# Patient Record
Sex: Female | Born: 1965 | Race: White | Hispanic: No | Marital: Single | Smoking: Never smoker
Health system: Southern US, Community
[De-identification: ages and names within clinical notes are randomized; demographics above are authoritative.]

## PROBLEM LIST (undated history)

## (undated) DIAGNOSIS — F419 Anxiety disorder, unspecified: Secondary | ICD-10-CM

## (undated) DIAGNOSIS — M549 Dorsalgia, unspecified: Secondary | ICD-10-CM

## (undated) DIAGNOSIS — G4700 Insomnia, unspecified: Secondary | ICD-10-CM

## (undated) DIAGNOSIS — R599 Enlarged lymph nodes, unspecified: Secondary | ICD-10-CM

## (undated) DIAGNOSIS — F438 Other reactions to severe stress: Secondary | ICD-10-CM

## (undated) HISTORY — DX: Dorsalgia, unspecified: M54.9

## (undated) HISTORY — DX: Insomnia, unspecified: G47.00

## (undated) HISTORY — DX: Other reactions to severe stress: F43.8

## (undated) HISTORY — PX: OTHER SURGICAL HISTORY: SHX169

## (undated) HISTORY — DX: Enlarged lymph nodes, unspecified: R59.9

## (undated) HISTORY — DX: Anxiety disorder, unspecified: F41.9

---

## 1985-10-29 HISTORY — PX: GYNECOLOGIC CRYOSURGERY: SHX857

## 1998-04-04 ENCOUNTER — Other Ambulatory Visit: Admission: RE | Admit: 1998-04-04 | Discharge: 1998-04-04 | Payer: Self-pay | Admitting: Obstetrics and Gynecology

## 1999-06-15 ENCOUNTER — Other Ambulatory Visit: Admission: RE | Admit: 1999-06-15 | Discharge: 1999-06-15 | Payer: Self-pay | Admitting: Obstetrics and Gynecology

## 2000-08-13 ENCOUNTER — Other Ambulatory Visit: Admission: RE | Admit: 2000-08-13 | Discharge: 2000-08-13 | Payer: Self-pay | Admitting: Obstetrics and Gynecology

## 2001-01-01 ENCOUNTER — Encounter: Payer: Self-pay | Admitting: Obstetrics and Gynecology

## 2001-01-01 ENCOUNTER — Ambulatory Visit (HOSPITAL_COMMUNITY): Admission: RE | Admit: 2001-01-01 | Discharge: 2001-01-01 | Payer: Self-pay | Admitting: Obstetrics and Gynecology

## 2001-10-29 HISTORY — PX: AUGMENTATION MAMMAPLASTY: SUR837

## 2001-12-09 ENCOUNTER — Other Ambulatory Visit: Admission: RE | Admit: 2001-12-09 | Discharge: 2001-12-09 | Payer: Self-pay | Admitting: Obstetrics and Gynecology

## 2003-01-11 ENCOUNTER — Encounter: Payer: Self-pay | Admitting: Emergency Medicine

## 2003-01-11 ENCOUNTER — Emergency Department (HOSPITAL_COMMUNITY): Admission: EM | Admit: 2003-01-11 | Discharge: 2003-01-12 | Payer: Self-pay | Admitting: Emergency Medicine

## 2003-01-26 ENCOUNTER — Other Ambulatory Visit: Admission: RE | Admit: 2003-01-26 | Discharge: 2003-01-26 | Payer: Self-pay | Admitting: Obstetrics and Gynecology

## 2003-03-12 ENCOUNTER — Encounter: Admission: RE | Admit: 2003-03-12 | Discharge: 2003-03-12 | Payer: Self-pay | Admitting: Family Medicine

## 2003-03-12 ENCOUNTER — Encounter: Payer: Self-pay | Admitting: Family Medicine

## 2003-05-28 ENCOUNTER — Ambulatory Visit (HOSPITAL_COMMUNITY): Admission: RE | Admit: 2003-05-28 | Discharge: 2003-05-28 | Payer: Self-pay | Admitting: Neurology

## 2004-03-07 ENCOUNTER — Other Ambulatory Visit: Admission: RE | Admit: 2004-03-07 | Discharge: 2004-03-07 | Payer: Self-pay | Admitting: Obstetrics and Gynecology

## 2006-03-29 ENCOUNTER — Other Ambulatory Visit: Admission: RE | Admit: 2006-03-29 | Discharge: 2006-03-29 | Payer: Self-pay | Admitting: Obstetrics and Gynecology

## 2006-05-28 ENCOUNTER — Encounter: Admission: RE | Admit: 2006-05-28 | Discharge: 2006-05-28 | Payer: Self-pay | Admitting: Obstetrics and Gynecology

## 2007-04-15 ENCOUNTER — Other Ambulatory Visit: Admission: RE | Admit: 2007-04-15 | Discharge: 2007-04-15 | Payer: Self-pay | Admitting: Obstetrics and Gynecology

## 2008-06-10 ENCOUNTER — Other Ambulatory Visit: Admission: RE | Admit: 2008-06-10 | Discharge: 2008-06-10 | Payer: Self-pay | Admitting: Obstetrics and Gynecology

## 2008-06-15 ENCOUNTER — Encounter: Admission: RE | Admit: 2008-06-15 | Discharge: 2008-06-15 | Payer: Self-pay | Admitting: Obstetrics and Gynecology

## 2009-02-15 ENCOUNTER — Ambulatory Visit: Payer: Self-pay | Admitting: Family Medicine

## 2009-02-15 DIAGNOSIS — R599 Enlarged lymph nodes, unspecified: Secondary | ICD-10-CM

## 2009-02-15 DIAGNOSIS — M549 Dorsalgia, unspecified: Secondary | ICD-10-CM | POA: Insufficient documentation

## 2009-02-15 DIAGNOSIS — G4700 Insomnia, unspecified: Secondary | ICD-10-CM

## 2009-02-15 HISTORY — DX: Insomnia, unspecified: G47.00

## 2009-02-15 HISTORY — DX: Dorsalgia, unspecified: M54.9

## 2009-02-15 HISTORY — DX: Enlarged lymph nodes, unspecified: R59.9

## 2009-03-24 ENCOUNTER — Encounter: Payer: Self-pay | Admitting: Family Medicine

## 2009-05-23 ENCOUNTER — Encounter: Payer: Self-pay | Admitting: Family Medicine

## 2009-06-13 ENCOUNTER — Other Ambulatory Visit: Admission: RE | Admit: 2009-06-13 | Discharge: 2009-06-13 | Payer: Self-pay | Admitting: Obstetrics and Gynecology

## 2009-06-13 ENCOUNTER — Encounter: Payer: Self-pay | Admitting: Obstetrics and Gynecology

## 2009-06-13 ENCOUNTER — Ambulatory Visit: Payer: Self-pay | Admitting: Obstetrics and Gynecology

## 2010-01-11 ENCOUNTER — Ambulatory Visit: Payer: Self-pay | Admitting: Family Medicine

## 2010-06-20 ENCOUNTER — Ambulatory Visit: Payer: Self-pay | Admitting: Obstetrics and Gynecology

## 2010-06-20 ENCOUNTER — Other Ambulatory Visit: Admission: RE | Admit: 2010-06-20 | Discharge: 2010-06-20 | Payer: Self-pay | Admitting: Obstetrics and Gynecology

## 2010-07-17 ENCOUNTER — Ambulatory Visit: Payer: Self-pay | Admitting: Family Medicine

## 2010-07-17 DIAGNOSIS — J029 Acute pharyngitis, unspecified: Secondary | ICD-10-CM | POA: Insufficient documentation

## 2010-07-17 DIAGNOSIS — B009 Herpesviral infection, unspecified: Secondary | ICD-10-CM | POA: Insufficient documentation

## 2010-07-17 DIAGNOSIS — J019 Acute sinusitis, unspecified: Secondary | ICD-10-CM | POA: Insufficient documentation

## 2010-07-21 ENCOUNTER — Encounter (INDEPENDENT_AMBULATORY_CARE_PROVIDER_SITE_OTHER): Payer: Self-pay | Admitting: *Deleted

## 2010-08-17 ENCOUNTER — Encounter (INDEPENDENT_AMBULATORY_CARE_PROVIDER_SITE_OTHER): Payer: Self-pay | Admitting: *Deleted

## 2010-08-21 ENCOUNTER — Ambulatory Visit: Payer: Self-pay | Admitting: Gastroenterology

## 2010-11-27 ENCOUNTER — Ambulatory Visit
Admission: RE | Admit: 2010-11-27 | Discharge: 2010-11-27 | Payer: Self-pay | Source: Home / Self Care | Attending: Family Medicine | Admitting: Family Medicine

## 2010-11-27 DIAGNOSIS — F438 Other reactions to severe stress: Secondary | ICD-10-CM

## 2010-11-27 DIAGNOSIS — F4389 Other reactions to severe stress: Secondary | ICD-10-CM

## 2010-11-27 DIAGNOSIS — F411 Generalized anxiety disorder: Secondary | ICD-10-CM | POA: Insufficient documentation

## 2010-11-27 HISTORY — DX: Other reactions to severe stress: F43.89

## 2010-11-27 HISTORY — DX: Other reactions to severe stress: F43.8

## 2010-11-30 NOTE — Letter (Signed)
Summary: Pre Visit Letter Revised  [facility]  [address] [place], [place] [address]   Phone: [phone]  Fax: [phone]        07/21/2010 MRN: [id number] [facility] [address] [place], [place]  [address]             Procedure Date:  09-04-10   Welcome to the Gastroenterology Division at [facility].    You are scheduled to see a nurse for your pre-procedure visit on 08-21-10 at 2:00p.m. on the 3rd floor at [facility], [address]  We ask that you try to arrive at our office 15 minutes prior to your appointment time to allow for check-in.  Please take a minute to review the attached form.  If you answer "Yes" to one or more of the questions on the first page, we ask that you call the person listed at your earliest opportunity.  If you answer "No" to all of the questions, please complete the rest of the form and bring it to your appointment.    Your nurse visit will consist of discussing your medical and surgical history, your immediate family medical history, and your medications.   If you are unable to list all of your medications on the form, please bring the medication bottles to your appointment and we will list them.  We will need to be aware of both prescribed and over the counter drugs.  We will need to know exact dosage information as well.    Please be prepared to read and sign documents such as consent forms, a financial agreement, and acknowledgement forms.  If necessary, and with your consent, a friend or relative is welcome to sit-in on the nurse visit with you.  Please bring your insurance card so that we may make a copy of it.  If your insurance requires a referral to see a specialist, please bring your referral form from your primary care physician.  No co-pay is required for this nurse visit.     If you cannot keep your appointment, please call [phone] to cancel or reschedule prior to your appointment date.  This allows  [place] the opportunity to schedule an appointment for another patient in need of care.    Thank you for choosing [facility] for your medical needs.  We appreciate the opportunity to care for you.  Please visit [place] at our website  to learn more about our practice.  Sincerely, The Gastroenterology Division
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
## 2010-11-30 NOTE — Miscellaneous (Signed)
Summary: previsit prep/rm  Clinical Lists Changes  Medications: Added new medication of MOVIPREP 100 GM  SOLR (PEG-KCL-NACL-NASULF-NA ASC-C) As per prep instructions. - Signed Rx of MOVIPREP 100 GM  SOLR (PEG-KCL-NACL-NASULF-NA ASC-C) As per prep instructions.;  #1 x 0;  Signed;  Entered by: Sherren Kerns RN;  Authorized by: Rachael Fee MD;  Method used: Electronically to [facility]. [phone]*, [address] [place], [place], [place]  [address], Ph: [phone], Fax: [phone] Observations: Added new observation of ALLERGY REV: Done (08/21/2010 14:02)    Prescriptions: MOVIPREP 100 GM  SOLR (PEG-KCL-NACL-NASULF-NA ASC-C) As per prep instructions.  #1 x 0   Entered by:   Sherren Kerns RN   Authorized by:   Rachael Fee MD   Signed by:   Sherren Kerns RN on 08/21/2010   Method used:   Electronically to        [facility] [phone]* (retail)       [address]       [place], [place]  [address]       Ph: [phone]       Fax: [phone]   RxID:   [id number]
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
## 2010-11-30 NOTE — Letter (Signed)
Summary: Moviprep Instructions  [facility]  [address]   [place], [place] [address]   Phone: [phone]  Fax: [phone]       Sara Camacho    1966/09/15    MRN: [id number]        Procedure Day /Date: Monday, 09-04-10     Arrival Time: 8:00 a.m.     Procedure Time: 9:00 a.m.     Location of Procedure:                     x  [facility] (4th Floor)                        PREPARATION FOR COLONOSCOPY WITH MOVIPREP   Starting 5 days prior to your procedure  08-30-10 do not eat nuts, seeds, popcorn, corn, beans, peas,  salads, or any raw vegetables.  Do not take any fiber supplements (e.g. Metamucil, Citrucel, and Benefiber).  THE DAY BEFORE YOUR PROCEDURE         DATE: 09-03-10   DAY: Sunday  1.  Drink clear liquids the entire day-NO SOLID FOOD  2.  Do not drink anything colored red or purple.  Avoid juices with pulp.  No orange juice.  3.  Drink at least 64 oz. (8 glasses) of fluid/clear liquids during the day to prevent dehydration and help the prep work efficiently.  CLEAR LIQUIDS INCLUDE: Water Jello Ice Popsicles Tea (sugar ok, no milk/cream) Powdered fruit flavored drinks Coffee (sugar ok, no milk/cream) Gatorade Juice: apple, white grape, white cranberry  Lemonade Clear bullion, consomm, broth Carbonated beverages (any kind) Strained chicken noodle soup Hard Candy                             4.  In the morning, mix first dose of MoviPrep solution:    Empty 1 Pouch A and 1 Pouch B into the disposable container    Add lukewarm drinking water to the top line of the container. Mix to dissolve    Refrigerate (mixed solution should be used within 24 hrs)  5.  Begin drinking the prep at 5:00 p.m. The MoviPrep container is divided by 4 marks.   Every 15 minutes drink the solution down to the next mark (approximately 8 oz) until the full liter is complete.   6.  Follow completed prep with 16 oz of clear liquid of your choice  (Nothing red or purple).  Continue to drink clear liquids until bedtime.  7.  Before going to bed, mix second dose of MoviPrep solution:    Empty 1 Pouch A and 1 Pouch B into the disposable container    Add lukewarm drinking water to the top line of the container. Mix to dissolve    Refrigerate  THE DAY OF YOUR PROCEDURE      DATE: 09-04-10  DAY: Monday  Beginning at  4:00 a.m. (5 hours before procedure):         1. Every 15 minutes, drink the solution down to the next mark (approx 8 oz) until the full liter is complete.  2. Follow completed prep with 16 oz. of clear liquid of your choice.    3. You may drink clear liquids until 7:00 a.m.   (2 HOURS BEFORE PROCEDURE).   MEDICATION INSTRUCTIONS  Unless otherwise instructed, you should take regular prescription medications with a small sip of water   as  early as possible the morning of your procedure.            OTHER INSTRUCTIONS  You will need a responsible adult at least 46 years of age to accompany you and drive you home.   This person must remain in the waiting room during your procedure.  Wear loose fitting clothing that is easily removed.  Leave jewelry and other valuables at home.  However, you may wish to bring a book to read or  an iPod/MP3 player to listen to music as you wait for your procedure to start.  Remove all body piercing jewelry and leave at home.  Total time from sign-in until discharge is approximately 2-3 hours.  You should go home directly after your procedure and rest.  You can resume normal activities the  day after your procedure.  The day of your procedure you should not:   Drive   Make legal decisions   Operate machinery   Drink alcohol   Return to work  You will receive specific instructions about eating, activities and medications before you leave.    The above instructions have been reviewed and explained to me by   Sherren Kerns RN  August 21, 2010 2:28 PM    I fully  understand and can verbalize these instructions _____________________________ Date _________
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
## 2010-12-22 ENCOUNTER — Other Ambulatory Visit: Payer: Self-pay | Admitting: Obstetrics and Gynecology

## 2010-12-22 DIAGNOSIS — Z1231 Encounter for screening mammogram for malignant neoplasm of breast: Secondary | ICD-10-CM

## 2010-12-27 ENCOUNTER — Ambulatory Visit
Admission: RE | Admit: 2010-12-27 | Discharge: 2010-12-27 | Disposition: A | Discharge status: Home / Self Care | Payer: Self-pay | Source: Ambulatory Visit | Attending: Obstetrics and Gynecology | Admitting: Obstetrics and Gynecology

## 2010-12-27 DIAGNOSIS — Z1231 Encounter for screening mammogram for malignant neoplasm of breast: Secondary | ICD-10-CM

## 2011-04-05 ENCOUNTER — Ambulatory Visit (INDEPENDENT_AMBULATORY_CARE_PROVIDER_SITE_OTHER)
Admission: RE | Admit: 2011-04-05 | Discharge: 2011-04-05 | Disposition: A | Discharge status: Home / Self Care | Payer: BC Managed Care – PPO | Source: Ambulatory Visit | Attending: Family Medicine | Admitting: Family Medicine

## 2011-04-05 ENCOUNTER — Ambulatory Visit (INDEPENDENT_AMBULATORY_CARE_PROVIDER_SITE_OTHER): Payer: BC Managed Care – PPO | Admitting: Family Medicine

## 2011-04-05 ENCOUNTER — Encounter: Payer: Self-pay | Admitting: Family Medicine

## 2011-04-05 DIAGNOSIS — M549 Dorsalgia, unspecified: Secondary | ICD-10-CM

## 2011-04-05 DIAGNOSIS — L719 Rosacea, unspecified: Secondary | ICD-10-CM

## 2011-04-05 DIAGNOSIS — L710 Perioral dermatitis: Secondary | ICD-10-CM

## 2011-04-05 MED ORDER — CEPHALEXIN 500 MG PO CAPS: 500.0000 mg | ORAL_CAPSULE | Freq: Two times a day (BID) | ORAL | Status: AC

## 2011-04-05 MED ORDER — MELOXICAM 15 MG PO TABS: 15.0000 mg | ORAL_TABLET | Freq: Every day | ORAL | Status: DC
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
## 2011-04-06 NOTE — Progress Notes (Signed)
Quick Note:  Left message on pt's voice mail. ______

## 2011-05-24 ENCOUNTER — Ambulatory Visit: Payer: BC Managed Care – PPO | Admitting: Family Medicine

## 2011-05-24 ENCOUNTER — Ambulatory Visit (INDEPENDENT_AMBULATORY_CARE_PROVIDER_SITE_OTHER): Payer: BC Managed Care – PPO | Admitting: Family Medicine

## 2011-05-24 VITALS — BP 120/70

## 2011-05-24 DIAGNOSIS — Z23 Encounter for immunization: Secondary | ICD-10-CM

## 2011-05-24 DIAGNOSIS — Z139 Encounter for screening, unspecified: Secondary | ICD-10-CM

## 2011-05-24 DIAGNOSIS — Z0289 Encounter for other administrative examinations: Secondary | ICD-10-CM

## 2011-05-24 MED ORDER — TRETINOIN 0.05 % EX CREA: TOPICAL_CREAM | Freq: Every day | CUTANEOUS | Status: DC

## 2011-05-24 NOTE — Progress Notes (Signed)
  Subjective:    Patient ID: Sara Camacho, female    DOB: Mar 30, 1966, 45 y.o.   MRN: [id number]  HPI Patient preparing to start phlebotomy program. No history of immunizations-pt could not locate records. She did have tetanus 2005. She does recall having immunizations as a child. She had clinical chickenpox. She has not had hepatitis B vaccine. No history of positive PPD and no specific risk factors. No recent concerning symptoms. Her program requires proof of measles, mumps, rubella positive serology if immunizations not documented.   Review of Systems  Constitutional: Negative for fever.  Skin: Negative for rash.  Neurological: Negative for headaches.       Objective:   Physical Exam  Constitutional: She appears well-developed and well-nourished.  Cardiovascular: Normal rate and regular rhythm.   Pulmonary/Chest: Breath sounds normal. No respiratory distress. She has no wheezes. She has no rales.          Assessment & Plan:  Screening for immunization serologies. Check for rubella, measles and mumps antibody levels. Recommended strongly that she start hepatitis B vaccine series and she consents to getting first today. PPD will be placed tomorrow and read in72 hours.  She'll confirm date of last tetanus

## 2011-05-25 ENCOUNTER — Encounter: Payer: Self-pay | Admitting: Family Medicine

## 2011-05-25 ENCOUNTER — Ambulatory Visit: Payer: BC Managed Care – PPO | Admitting: Family Medicine

## 2011-05-25 DIAGNOSIS — Z111 Encounter for screening for respiratory tuberculosis: Secondary | ICD-10-CM

## 2011-05-25 DIAGNOSIS — Z299 Encounter for prophylactic measures, unspecified: Secondary | ICD-10-CM

## 2011-05-25 LAB — RUBEOLA ANTIBODY IGG: Rubeola IgG: 4.33 {ISR} — ABNORMAL HIGH

## 2011-05-25 LAB — MUMPS ANTIBODY, IGG: Mumps IgG: 3.37 {ISR} — ABNORMAL HIGH

## 2011-05-25 NOTE — Progress Notes (Signed)
Quick Note:  Copy given to pt ______

## 2011-05-28 ENCOUNTER — Ambulatory Visit: Payer: BC Managed Care – PPO | Admitting: Family Medicine

## 2011-05-28 LAB — RUBELLA ANTIBODY, IGM: Rubella IgM: 0.35 Ratio (ref <0.90)

## 2011-05-28 LAB — TB SKIN TEST: TB Skin Test: NEGATIVE mm

## 2011-06-19 ENCOUNTER — Other Ambulatory Visit: Payer: Self-pay | Admitting: Family Medicine

## 2011-06-19 NOTE — Telephone Encounter (Signed)
Last filled 11-27-10 #30 with 5 refills Last acute OV shoulder pain 6/12

## 2011-06-19 NOTE — Telephone Encounter (Signed)
Ok to refill once with 2 additional refills.

## 2011-08-02 ENCOUNTER — Other Ambulatory Visit: Payer: Self-pay | Admitting: Obstetrics and Gynecology

## 2011-08-06 ENCOUNTER — Encounter: Payer: Self-pay | Admitting: Gynecology

## 2011-08-08 ENCOUNTER — Encounter: Payer: Self-pay | Admitting: Obstetrics and Gynecology

## 2011-08-08 ENCOUNTER — Other Ambulatory Visit (HOSPITAL_COMMUNITY)
Admission: RE | Admit: 2011-08-08 | Discharge: 2011-08-08 | Disposition: A | Discharge status: Home / Self Care | Payer: BC Managed Care – PPO | Source: Ambulatory Visit | Attending: Obstetrics and Gynecology | Admitting: Obstetrics and Gynecology

## 2011-08-08 ENCOUNTER — Ambulatory Visit (INDEPENDENT_AMBULATORY_CARE_PROVIDER_SITE_OTHER): Payer: BC Managed Care – PPO | Admitting: Obstetrics and Gynecology

## 2011-08-08 VITALS — BP 114/70 | Ht 62.0 in | Wt 114.0 lb

## 2011-08-08 DIAGNOSIS — Z113 Encounter for screening for infections with a predominantly sexual mode of transmission: Secondary | ICD-10-CM

## 2011-08-08 DIAGNOSIS — B009 Herpesviral infection, unspecified: Secondary | ICD-10-CM

## 2011-08-08 DIAGNOSIS — Z01419 Encounter for gynecological examination (general) (routine) without abnormal findings: Secondary | ICD-10-CM | POA: Insufficient documentation

## 2011-08-08 DIAGNOSIS — L708 Other acne: Secondary | ICD-10-CM

## 2011-08-08 DIAGNOSIS — Z309 Encounter for contraceptive management, unspecified: Secondary | ICD-10-CM

## 2011-08-08 DIAGNOSIS — L709 Acne, unspecified: Secondary | ICD-10-CM

## 2011-08-08 DIAGNOSIS — Z13220 Encounter for screening for lipoid disorders: Secondary | ICD-10-CM

## 2011-08-08 DIAGNOSIS — N870 Mild cervical dysplasia: Secondary | ICD-10-CM | POA: Insufficient documentation

## 2011-08-08 DIAGNOSIS — G479 Sleep disorder, unspecified: Secondary | ICD-10-CM

## 2011-08-08 MED ORDER — ZOLPIDEM TARTRATE 10 MG PO TABS: 10.0000 mg | ORAL_TABLET | Freq: Every evening | ORAL | Status: DC | PRN

## 2011-08-08 MED ORDER — NORGESTIM-ETH ESTRAD TRIPHASIC 0.18/0.215/0.25 MG-35 MCG PO TABS: ORAL_TABLET | ORAL | Status: DC

## 2011-08-08 MED ORDER — VALACYCLOVIR HCL 1 G PO TABS: 1000.0000 mg | ORAL_TABLET | Freq: Every day | ORAL | Status: DC

## 2011-08-08 MED ORDER — TRETINOIN 0.05 % EX CREA: TOPICAL_CREAM | Freq: Every day | CUTANEOUS | Status: DC

## 2011-08-08 NOTE — Progress Notes (Signed)
Patient came to see me today for her annual GYN exam. She remains on continuous birth control pills and therefore does not have a withdrawal bleed. She is up-to-date on mammograms. She takes Valtrex for fever blisters. She uses Retin-A when she has trouble with acne. She uses Ambien for sleep disturbance.  Physical examination: HEENT within normal limits. Neck: Thyroid not large. No masses. Supraclavicular nodes: not enlarged. Breasts: Examined in both sitting midline position. No skin changes and no masses. Abdomen: Soft no guarding rebound or masses or hernia. Pelvic: External: Within normal limits. BUS: Within normal limits. Vaginal:within normal limits. Good estrogen effect. No evidence of cystocele rectocele or enterocele. Cervix: clean. Uterus: Normal size and shape. Adnexa: No masses. Rectovaginal exam: Confirmatory and negative. Extremities: Within normal limits.  Assessment: Normal GYN exam. HSV 1.  Plan: Continue her birth control pills continuously. If she has BTB stopped pills for 4 days and then restart them. Her current partner is of 8 months duration. We did GC and chlamydial cultures today. We refilled her Ambien and her Valtrex today. I refilled her Retin-A but told her I thought she should discuss that with Dr. Nicholas Lose and let her prescribed in the future.

## 2011-09-07 ENCOUNTER — Encounter: Payer: BC Managed Care – PPO | Admitting: Obstetrics and Gynecology

## 2011-10-18 ENCOUNTER — Telehealth: Payer: Self-pay | Admitting: *Deleted

## 2011-10-18 NOTE — Telephone Encounter (Signed)
Pt called stating she lost her prescription  for generic Ambien 10 mg. Pt has refills at the pharmacy, but she can not pick up unless she can get the okay from you. Pt is leaving out to town for [place] on Friday. Please advise

## 2011-10-18 NOTE — Telephone Encounter (Signed)
Okay to refill? 

## 2011-10-18 NOTE — Telephone Encounter (Signed)
rx called in, okay for pharmacy to refill rx.

## 2011-12-12 ENCOUNTER — Encounter: Payer: Self-pay | Admitting: Family Medicine

## 2011-12-12 ENCOUNTER — Ambulatory Visit (INDEPENDENT_AMBULATORY_CARE_PROVIDER_SITE_OTHER): Payer: BC Managed Care – PPO | Admitting: Family Medicine

## 2011-12-12 VITALS — BP 112/68 | Temp 98.0°F | Wt 114.0 lb

## 2011-12-12 DIAGNOSIS — J029 Acute pharyngitis, unspecified: Secondary | ICD-10-CM

## 2011-12-12 MED ORDER — BENZOYL PEROXIDE-ERYTHROMYCIN 5-3 % EX GEL: Freq: Two times a day (BID) | CUTANEOUS | Status: DC

## 2011-12-12 MED ORDER — HYDROCODONE-HOMATROPINE 5-1.5 MG/5ML PO SYRP: 5.0000 mL | ORAL_SOLUTION | Freq: Four times a day (QID) | ORAL | Status: AC | PRN

## 2011-12-12 NOTE — Progress Notes (Signed)
  Subjective:    Patient ID: Sara Camacho, female    DOB: [birth year], 46 y.o.   MRN: [id number]  HPI  Patient seen with 7-10 day history of intermittent sore throat, cough, and nasal congestion. Increased malaise. Denies any fever or chills. No nausea, vomiting, or diarrhea. Cough especially bothersome at night. No relief with over-the-counter medications. Patient nonsmoker. No history of asthma.   Review of Systems  Constitutional: Positive for fatigue. Negative for fever and chills.  HENT: Positive for congestion and sore throat. Negative for trouble swallowing.   Respiratory: Positive for cough. Negative for shortness of breath and wheezing.   Neurological: Negative for headaches.       Objective:   Physical Exam  Constitutional: She appears well-developed and well-nourished.  HENT:  Right Ear: External ear normal.  Left Ear: External ear normal.       Mild posterior pharynx erythema. No exudate  Neck: Neck supple.  Cardiovascular: Normal rate and regular rhythm.   Pulmonary/Chest: Effort normal and breath sounds normal. No respiratory distress. She has no wheezes. She has no rales.  Lymphadenopathy:    She has no cervical adenopathy.          Assessment & Plan:  Viral syndrome. Rapid strep negative. Hycodan cough syrup for my time use as needed. Followup when necessary

## 2011-12-12 NOTE — Patient Instructions (Signed)
Follow up promptly for any fever or worsening symptoms 

## 2011-12-12 NOTE — Progress Notes (Signed)
Addended by: Kristian Covey on: 12/12/2011 06:14 PM   Modules accepted: Orders

## 2012-01-23 ENCOUNTER — Other Ambulatory Visit: Payer: Self-pay | Admitting: Obstetrics and Gynecology

## 2012-01-23 NOTE — Telephone Encounter (Signed)
rx called in

## 2012-01-29 ENCOUNTER — Telehealth: Payer: Self-pay | Admitting: *Deleted

## 2012-01-29 ENCOUNTER — Emergency Department (INDEPENDENT_AMBULATORY_CARE_PROVIDER_SITE_OTHER): Payer: BC Managed Care – PPO

## 2012-01-29 ENCOUNTER — Other Ambulatory Visit: Payer: Self-pay

## 2012-01-29 ENCOUNTER — Emergency Department (HOSPITAL_BASED_OUTPATIENT_CLINIC_OR_DEPARTMENT_OTHER)
Admission: EM | Admit: 2012-01-29 | Discharge: 2012-01-29 | Disposition: A | Discharge status: Home / Self Care | Payer: BC Managed Care – PPO | Attending: Emergency Medicine | Admitting: Emergency Medicine

## 2012-01-29 DIAGNOSIS — R0602 Shortness of breath: Secondary | ICD-10-CM

## 2012-01-29 DIAGNOSIS — R079 Chest pain, unspecified: Secondary | ICD-10-CM

## 2012-01-29 DIAGNOSIS — F411 Generalized anxiety disorder: Secondary | ICD-10-CM | POA: Insufficient documentation

## 2012-01-29 DIAGNOSIS — E876 Hypokalemia: Secondary | ICD-10-CM | POA: Insufficient documentation

## 2012-01-29 LAB — COMPREHENSIVE METABOLIC PANEL
Alkaline Phosphatase: 32 U/L — ABNORMAL LOW (ref 39–117)
BUN: 8 mg/dL (ref 6–23)
Calcium: 10.2 mg/dL (ref 8.4–10.5)
GFR calc Af Amer: >90 mL/min (ref >90)
GFR calc non Af Amer: >90 mL/min (ref >90)
Glucose, Bld: 92 mg/dL (ref 70–99)
Total Protein: 7.5 g/dL (ref 6.0–8.3)

## 2012-01-29 LAB — CBC
HCT: 35.8 % — ABNORMAL LOW (ref 36.0–46.0)
Hemoglobin: 12.5 g/dL (ref 12.0–15.0)
MCH: 31.3 pg (ref 26.0–34.0)
MCV: 89.7 fL (ref 78.0–100.0)
RBC: 3.99 MIL/uL (ref 3.87–5.11)

## 2012-01-29 LAB — D-DIMER, QUANTITATIVE: D-Dimer, Quant: 0.32 ug/mL-FEU (ref 0.00–0.48)

## 2012-01-29 LAB — DIFFERENTIAL
Eosinophils Absolute: 0 10*3/uL (ref 0.0–0.7)
Eosinophils Relative: 0 % (ref 0–5)
Lymphs Abs: 2 10*3/uL (ref 0.7–4.0)
Monocytes Relative: 9 % (ref 3–12)

## 2012-01-29 LAB — URINALYSIS, ROUTINE W REFLEX MICROSCOPIC
Hgb urine dipstick: NEGATIVE
Ketones, ur: 15 mg/dL — AB
Protein, ur: NEGATIVE mg/dL
Urobilinogen, UA: 0.2 mg/dL (ref 0.0–1.0)

## 2012-01-29 LAB — LIPASE, BLOOD: Lipase: 29 U/L (ref 11–59)

## 2012-01-29 MED ORDER — LORAZEPAM 1 MG PO TABS
1.0000 mg | ORAL_TABLET | Freq: Once | ORAL | Status: AC
  Administered 2012-01-29: 1 mg via ORAL
  Filled 2012-01-29: qty 1

## 2012-01-29 MED ORDER — POTASSIUM CHLORIDE CRYS ER 20 MEQ PO TBCR
40.0000 meq | EXTENDED_RELEASE_TABLET | Freq: Once | ORAL | Status: AC
  Administered 2012-01-29: 40 meq via ORAL
  Filled 2012-01-29: qty 2

## 2012-01-29 MED ORDER — GI COCKTAIL ~~LOC~~
30.0000 mL | Freq: Once | ORAL | Status: AC
  Administered 2012-01-29: 30 mL via ORAL
  Filled 2012-01-29: qty 30

## 2012-01-29 NOTE — Telephone Encounter (Signed)
Pt called and stated she was having chest tightness every other night for 1 wk and now it is progressing during the day starting today.. No SOB, no jaw pain, no left arm numbness and tingling.  Told pt it was too late to work her in cause she has called at 4:45 pm and she needed to go to the ER.  Talked to Adline Mango, FNP to see if pt could possibly be worked in tomorrow with another physician but Oran Rein would like for her to go to the ER.  Pt aware

## 2012-01-29 NOTE — ED Notes (Signed)
Pt. Is c/o chest pain and numbness in the L chest and L arm and L hand.  Pt. Is in no resp. Distress and reports feeling weak.

## 2012-01-29 NOTE — ED Provider Notes (Signed)
History     CSN: [id number]  Arrival date & time 01/29/12  1910   First MD Initiated Contact with Patient 01/29/12 1937      Chief Complaint  Patient presents with  . Chest Pain    with tingling and numbness in the L hand    (Consider location/radiation/quality/duration/timing/severity/associated sxs/prior treatment) HPI Comments: Patient complains of substernal chest pain onset around 4:30 in the center of her chest it has been constant since that time. She's had intermittent chest pains in the past but never lasting this long and never radiated into her arms as it is today. She endorses tingling and numbness in her bilateral arms worse on the left. She denies any shortness of breath, nausea, vomiting, abdominal pain, back pain. She has no cardiac history and never had a stress test. She is not have hypertension, hyperlipidemia does not smoke. He took a Scientist, research (medical) for the pain without relief. She appears anxious.  The history is provided by the patient.    Past Medical History  Diagnosis Date  . INSOMNIA, CHRONIC 02/15/2009  . BACK PAIN, UPPER 02/15/2009  . CERVICAL LYMPHADENOPATHY 02/15/2009  . ANXIETY, SITUATIONAL 11/27/2010  . CIN I (cervical intraepithelial neoplasia I)     Past Surgical History  Procedure Date  . Augmentation mammaplasty     augmentation-removal-reinsertion  . Eyebrow lift   . Colposcopy     Family History  Problem Relation Age of Onset  . Cancer Father     colon  . Breast cancer Maternal Grandmother   . Cancer Mother     Lymphoma    History  Substance Use Topics  . Smoking status: Never Smoker   . Smokeless tobacco: Not on file  . Alcohol Use: Yes     rare    OB History    Grav Para Term Preterm Abortions TAB SAB Ect Mult Living   1    1           Review of Systems  Constitutional: Positive for activity change and appetite change. Negative for fever.  HENT: Negative for congestion and rhinorrhea.   Respiratory: Positive for chest tightness.  Negative for cough and shortness of breath.   Cardiovascular: Positive for chest pain. Negative for leg swelling.  Gastrointestinal: Negative for nausea, vomiting and abdominal pain.  Genitourinary: Negative for dysuria, hematuria, vaginal bleeding and vaginal discharge.  Musculoskeletal: Negative for back pain.  Neurological: Negative for headaches.    Allergies  Penicillins  Home Medications   Current Outpatient Rx  Name Route Sig Dispense Refill  . BENZOYL PEROXIDE-ERYTHROMYCIN 5-3 % EX GEL Topical Apply 1 application topically daily.    Marland Kitchen CALCIUM GUMMIES PO Oral Take 2 each by mouth daily.    Marland Kitchen DIPHENHYDRAMINE HCL 25 MG PO TABS Oral Take 25 mg by mouth once as needed. For allergies    . ADULT MULTIVITAMIN W/MINERALS CH Oral Take 1 tablet by mouth daily.    Marland Kitchen NORGESTIM-ETH ESTRAD TRIPHASIC 0.18/0.215/0.25 MG-35 MCG PO TABS  Take one pill daily of active pills 35 tablet 12    Annual exam due.  . TRETINOIN 0.05 % EX CREA Topical Apply 1 application topically daily.    Marland Kitchen ZOLPIDEM TARTRATE 10 MG PO TABS  TAKE 1 TABLET BY MOUTH EVERY NIGHT AT BEDTIME FOR SLEEP 30 tablet 5  . VALACYCLOVIR HCL 1 G PO TABS Oral Take 1 tablet (1,000 mg total) by mouth daily. Take as directed prn  30 tablet 1    BP 132/74  Pulse 89  Temp(Src) 98.4 F (36.9 C) (Oral)  Resp 15  Ht 5\' 2"  (1.575 m)  Wt 102 lb (46.267 kg)  BMI 18.66 kg/m2  SpO2 100%  Physical Exam  Constitutional: She is oriented to person, place, and time. She appears well-developed and well-nourished. No distress.       Appears anxious  HENT:  Head: Normocephalic and atraumatic.  Mouth/Throat: Oropharynx is clear and moist. No oropharyngeal exudate.  Eyes: Conjunctivae are normal. Pupils are equal, round, and reactive to light.  Neck: Normal range of motion.  Cardiovascular: Normal rate, regular rhythm and normal heart sounds.   Pulmonary/Chest: Breath sounds normal. She exhibits tenderness.       Somewhat reproducible chest    Abdominal: Soft. There is no tenderness. There is no rebound and no guarding.  Musculoskeletal: Normal range of motion. She exhibits no edema and no tenderness.  Neurological: She is alert and oriented to person, place, and time. No cranial nerve deficit.  Skin: Skin is warm.    ED Course  Procedures (including critical care time)  Labs Reviewed  CBC - Abnormal; Notable for the following:    HCT 35.8 (*)    All other components within normal limits  COMPREHENSIVE METABOLIC PANEL - Abnormal; Notable for the following:    Potassium 3.3 (*)    Alkaline Phosphatase 32 (*)    All other components within normal limits  URINALYSIS, ROUTINE W REFLEX MICROSCOPIC - Abnormal; Notable for the following:    Ketones, ur 15 (*)    All other components within normal limits  DIFFERENTIAL  D-DIMER, QUANTITATIVE  TROPONIN I  LIPASE, BLOOD   Dg Chest 2 View  01/29/2012  *RADIOLOGY REPORT*  Clinical Data:  Chest pain.  CHEST - 2 VIEW  Comparison: None  Findings: The heart size and mediastinal contours are within normal limits.  Both lungs are clear.  The visualized skeletal structures are unremarkable.  IMPRESSION: No active disease.  Original Report Authenticated By: Reola Calkins, M.D.     1. Chest pain   2. Hypokalemia       MDM  Substernal chest pain associated bilateral arm tingling. Patient no distress, vitals stable.  Given lack of risk factors and history exam, low suspicion for ACS.  D-dimer negative. Troponin negative. Pain has been constant for the past 4 hours of ACS effectively ruled out: Troponin. EKG normal. Stable for outpatient followup with PCP   Date: 01/29/2012  Rate: 82  Rhythm: normal sinus rhythm  QRS Axis: normal  Intervals: normal  ST/T Wave abnormalities: normal  Conduction Disutrbances:none  Narrative Interpretation:   Old EKG Reviewed: none available         Glynn Octave, MD 01/29/12 2317

## 2012-01-29 NOTE — Discharge Instructions (Signed)
Chest Pain (Nonspecific) There is no evidence of heart attack or blood clot in the lungs.  Follow up with your doctor for a stress test.  Return to the ED if you develop new or worsening symptoms. It is often hard to give a specific diagnosis for the cause of chest pain. There is always a chance that your pain could be related to something serious, such as a heart attack or a blood clot in the lungs. You need to follow up with your caregiver for further evaluation. CAUSES   Heartburn.   Pneumonia or bronchitis.   Anxiety or stress.   Inflammation around your heart (pericarditis) or lung (pleuritis or pleurisy).   A blood clot in the lung.   A collapsed lung (pneumothorax). It can develop suddenly on its own (spontaneous pneumothorax) or from injury (trauma) to the chest.   Shingles infection (herpes zoster virus).  The chest wall is composed of bones, muscles, and cartilage. Any of these can be the source of the pain.  The bones can be bruised by injury.   The muscles or cartilage can be strained by coughing or overwork.   The cartilage can be affected by inflammation and become sore (costochondritis).  DIAGNOSIS  Lab tests or other studies, such as X-rays, electrocardiography, stress testing, or cardiac imaging, may be needed to find the cause of your pain.  TREATMENT   Treatment depends on what may be causing your chest pain. Treatment may include:   Acid blockers for heartburn.   Anti-inflammatory medicine.   Pain medicine for inflammatory conditions.   Antibiotics if an infection is present.   You may be advised to change lifestyle habits. This includes stopping smoking and avoiding alcohol, caffeine, and chocolate.   You may be advised to keep your head raised (elevated) when sleeping. This reduces the chance of acid going backward from your stomach into your esophagus.   Most of the time, nonspecific chest pain will improve within 2 to 3 days with rest and mild pain  medicine.  HOME CARE INSTRUCTIONS   If antibiotics were prescribed, take your antibiotics as directed. Finish them even if you start to feel better.   For the next few days, avoid physical activities that bring on chest pain. Continue physical activities as directed.   Do not smoke.   Avoid drinking alcohol.   Only take over-the-counter or prescription medicine for pain, discomfort, or fever as directed by your caregiver.   Follow your caregiver's suggestions for further testing if your chest pain does not go away.   Keep any follow-up appointments you made. If you do not go to an appointment, you could develop lasting (chronic) problems with pain. If there is any problem keeping an appointment, you must call to reschedule.  SEEK MEDICAL CARE IF:   You think you are having problems from the medicine you are taking. Read your medicine instructions carefully.   Your chest pain does not go away, even after treatment.   You develop a rash with blisters on your chest.  SEEK IMMEDIATE MEDICAL CARE IF:   You have increased chest pain or pain that spreads to your arm, neck, jaw, back, or abdomen.   You develop shortness of breath, an increasing cough, or you are coughing up blood.   You have severe back or abdominal pain, feel nauseous, or vomit.   You develop severe weakness, fainting, or chills.   You have a fever.  THIS IS AN EMERGENCY. Do not wait to see  if the pain will go away. Get medical help at once. Call your local emergency services (911 in [place] Do not drive yourself to the hospital. MAKE SURE YOU:   Understand these instructions.   Will watch your condition.   Will get help right away if you are not doing well or get worse.  Document Released: 07/25/2005 Document Revised: 10/04/2011 Document Reviewed: 05/20/2008 [facility] Patient Information 2012 [place], [place].

## 2012-01-30 ENCOUNTER — Telehealth: Payer: Self-pay | Admitting: *Deleted

## 2012-01-30 ENCOUNTER — Encounter (HOSPITAL_BASED_OUTPATIENT_CLINIC_OR_DEPARTMENT_OTHER): Payer: Self-pay | Admitting: Emergency Medicine

## 2012-01-30 NOTE — Telephone Encounter (Signed)
Pt called stating pharmacy never got her rx Ambien 10 mg #30 5 refills called in to pharmacy. Left message on pt vm this has been done.

## 2012-02-04 ENCOUNTER — Telehealth: Payer: Self-pay | Admitting: Family Medicine

## 2012-02-04 NOTE — Telephone Encounter (Signed)
Pt was informed no am appt available tomorrow, she is working the remainder of the week.  Agreed to schedule OV Monday 4/15 at 11:45 am.  Pt aware of date and time.  Please schedule

## 2012-02-04 NOTE — Telephone Encounter (Signed)
Patient called stating that she was seen at the ER for chest pains and she need an appt asap and is available tomorrow morning. We do not have any opening to fit her schedule. Please advise.

## 2012-02-11 ENCOUNTER — Ambulatory Visit (INDEPENDENT_AMBULATORY_CARE_PROVIDER_SITE_OTHER): Payer: BC Managed Care – PPO | Admitting: Family Medicine

## 2012-02-11 ENCOUNTER — Encounter: Payer: Self-pay | Admitting: Family Medicine

## 2012-02-11 VITALS — BP 102/68 | Temp 98.0°F | Wt 104.0 lb

## 2012-02-11 DIAGNOSIS — F411 Generalized anxiety disorder: Secondary | ICD-10-CM

## 2012-02-11 DIAGNOSIS — F419 Anxiety disorder, unspecified: Secondary | ICD-10-CM

## 2012-02-11 DIAGNOSIS — R0789 Other chest pain: Secondary | ICD-10-CM

## 2012-02-11 DIAGNOSIS — R634 Abnormal weight loss: Secondary | ICD-10-CM

## 2012-02-11 DIAGNOSIS — E876 Hypokalemia: Secondary | ICD-10-CM

## 2012-02-11 LAB — BASIC METABOLIC PANEL
BUN: 16 mg/dL (ref 6–23)
Chloride: 104 mEq/L (ref 96–112)
Creatinine, Ser: 0.7 mg/dL (ref 0.4–1.2)
Glucose, Bld: 86 mg/dL (ref 70–99)

## 2012-02-11 MED ORDER — ZOLPIDEM TARTRATE 10 MG PO TABS: 5.0000 mg | ORAL_TABLET | Freq: Every evening | ORAL | Status: DC | PRN

## 2012-02-11 MED ORDER — LORAZEPAM 0.5 MG PO TABS: 0.5000 mg | ORAL_TABLET | Freq: Four times a day (QID) | ORAL | Status: DC | PRN

## 2012-02-11 NOTE — Progress Notes (Signed)
  Subjective:    Patient ID: Sara Camacho, female    DOB: Jan 07, 1966, 46 y.o.   MRN: [id number]  HPI  ER followup. Patient presented recently with atypical chest pain on April 2. She had EKG, chest x-ray, and laboratory assessment unremarkable. D-dimer negative. Increased stress recently. She thinks this may be related to her stress issues. She denies any reflux symptoms. No exertional symptoms. Patient continues to have some intermittent chest pain which are nonexertional. Substernal without radiation. She had some associated tingling sensation left arm initially. Denies any neck symptoms. Occasionally sharp pain. Symptoms last most of the day. No dysphagia. Patient is nonsmoker. No family history of premature CAD. No history of hypertension or diabetes.  Patient had slightly low potassium emergency room 3.3. Given oral replacement. She's had some recent weight loss about 10 pounds. She attributes to stress. Does not feel particularly depressed. Had some intermittent hair loss and apparently had thyroid functions previously though not recently. Denies recent diarrhea. Good appetite. Eating regularly. No history of bulimia. No history of anorexia nervosa.  Chronic insomnia. Requesting refill Ambien.  No regular ETOH.  Past Medical History  Diagnosis Date  . INSOMNIA, CHRONIC 02/15/2009  . BACK PAIN, UPPER 02/15/2009  . CERVICAL LYMPHADENOPATHY 02/15/2009  . ANXIETY, SITUATIONAL 11/27/2010  . CIN I (cervical intraepithelial neoplasia I)    Past Surgical History  Procedure Date  . Augmentation mammaplasty     augmentation-removal-reinsertion  . Eyebrow lift   . Colposcopy     reports that she has never smoked. She does not have any smokeless tobacco history on file. She reports that she drinks alcohol. Her drug history not on file. family history includes Breast cancer in her maternal grandmother and Cancer in her father and mother. Allergies  Allergen Reactions  . Penicillins Other (See  Comments)    Childhood history      Review of Systems  Constitutional: Positive for unexpected weight change. Negative for fever, chills and appetite change.  HENT: Negative for trouble swallowing.   Respiratory: Negative for cough, shortness of breath and wheezing.   Cardiovascular: Positive for chest pain. Negative for palpitations and leg swelling.  Gastrointestinal: Negative for abdominal pain.  Genitourinary: Negative for dysuria.  Neurological: Negative for dizziness and headaches.  Psychiatric/Behavioral: The patient is nervous/anxious.        Objective:   Physical Exam  Constitutional: She is oriented to person, place, and time. She appears well-developed and well-nourished.  Neck: Neck supple. No thyromegaly present.  Cardiovascular: Normal rate and regular rhythm.   Pulmonary/Chest: Effort normal and breath sounds normal. No respiratory distress. She has no wheezes. She has no rales.  Musculoskeletal: She exhibits no edema.  Neurological: She is alert and oriented to person, place, and time. No cranial nerve deficit.          Assessment & Plan:  #1 atypical chest pain. Suspect stress related. Discussed counseling. Limited refill Ativan. No evidence for clinical depression.  #2 weight loss probably related to stress/ anxiety. Check TSH to rule out hyperthyroidism. No evidence for clinical depression #3 recent mild hypokalemia. Recheck basic metabolic panel  #4 history of chronic intermittent insomnia. Refill Ambien

## 2012-02-12 NOTE — Progress Notes (Signed)
Quick Note:  Pt informed on VM ______ 

## 2012-02-13 ENCOUNTER — Telehealth: Payer: Self-pay | Admitting: Family Medicine

## 2012-02-13 NOTE — Telephone Encounter (Signed)
Please advise 

## 2012-02-13 NOTE — Telephone Encounter (Signed)
Pt called re: lab results. Pt wanted to know if a med was going to be called in based on results? Pls call in to [facility] and [place].

## 2012-02-13 NOTE — Telephone Encounter (Signed)
Pt informed, she voiced her understanding

## 2012-02-13 NOTE — Telephone Encounter (Signed)
No need for any additional medications.  She had asked for refill Propecia (which apparently had gotten from [facility] in past).   This drug is category X in females of child bearing age.  I know she states she has not been sexually active recently but I feel this is an unsafe medication for her.

## 2012-02-26 ENCOUNTER — Ambulatory Visit (INDEPENDENT_AMBULATORY_CARE_PROVIDER_SITE_OTHER): Payer: BC Managed Care – PPO | Admitting: Obstetrics and Gynecology

## 2012-02-26 DIAGNOSIS — N949 Unspecified condition associated with female genital organs and menstrual cycle: Secondary | ICD-10-CM

## 2012-02-26 DIAGNOSIS — B9689 Other specified bacterial agents as the cause of diseases classified elsewhere: Secondary | ICD-10-CM

## 2012-02-26 DIAGNOSIS — N898 Other specified noninflammatory disorders of vagina: Secondary | ICD-10-CM

## 2012-02-26 DIAGNOSIS — N760 Acute vaginitis: Secondary | ICD-10-CM

## 2012-02-26 DIAGNOSIS — A499 Bacterial infection, unspecified: Secondary | ICD-10-CM

## 2012-02-26 LAB — WET PREP FOR TRICH, YEAST, CLUE: Yeast Wet Prep HPF POC: NONE SEEN

## 2012-02-26 MED ORDER — METRONIDAZOLE 0.75 % VA GEL: 1.0000 | Freq: Every day | VAGINAL | Status: AC

## 2012-02-26 NOTE — Progress Notes (Signed)
Patient came in today with a two-month history of a disagreeable vaginal odor. She is aware of a discharge. She is having no itching. She recently has broken up with her boyfriend and is not sexually active.  Exam: Kennon Portela present. Ext: wnl Vaginal: frothy discharge with many whcs and bacteria on wet prep.  Assessment: Bacterial vaginosis  Plan: Metrogel  vaginal cream at bedtime in the vagina for 5 days.

## 2012-03-17 ENCOUNTER — Ambulatory Visit (INDEPENDENT_AMBULATORY_CARE_PROVIDER_SITE_OTHER): Payer: BC Managed Care – PPO | Admitting: Family Medicine

## 2012-03-17 ENCOUNTER — Encounter: Payer: Self-pay | Admitting: Family Medicine

## 2012-03-17 VITALS — BP 82/58 | Temp 97.8°F | Wt 107.0 lb

## 2012-03-17 DIAGNOSIS — R634 Abnormal weight loss: Secondary | ICD-10-CM

## 2012-03-17 DIAGNOSIS — F439 Reaction to severe stress, unspecified: Secondary | ICD-10-CM

## 2012-03-17 DIAGNOSIS — S60222A Contusion of left hand, initial encounter: Secondary | ICD-10-CM

## 2012-03-17 DIAGNOSIS — Z733 Stress, not elsewhere classified: Secondary | ICD-10-CM

## 2012-03-17 DIAGNOSIS — S60229A Contusion of unspecified hand, initial encounter: Secondary | ICD-10-CM
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
## 2012-05-30 ENCOUNTER — Other Ambulatory Visit: Payer: Self-pay | Admitting: *Deleted

## 2012-05-30 MED ORDER — NORGESTIM-ETH ESTRAD TRIPHASIC 0.18/0.215/0.25 MG-35 MCG PO TABS: ORAL_TABLET | ORAL | Status: DC

## 2012-07-31 ENCOUNTER — Other Ambulatory Visit: Payer: Self-pay | Admitting: Obstetrics and Gynecology

## 2012-08-07 ENCOUNTER — Telehealth: Payer: Self-pay | Admitting: *Deleted

## 2012-08-07 MED ORDER — ZOLPIDEM TARTRATE 10 MG PO TABS: 10.0000 mg | ORAL_TABLET | Freq: Every evening | ORAL | Status: DC | PRN

## 2012-08-07 NOTE — Telephone Encounter (Signed)
Okay to change to one qhs

## 2012-08-07 NOTE — Telephone Encounter (Signed)
Received fax from [facility], "Pt has requested Zolpidem 1/2 tab daily and she is requesting that be changed to 1 tab daily at bedtime".   Med last refill was at [facility] 02/11/12, #30 with 5 refills.

## 2012-08-25 ENCOUNTER — Encounter: Payer: Self-pay | Admitting: Obstetrics and Gynecology

## 2012-08-25 ENCOUNTER — Ambulatory Visit (INDEPENDENT_AMBULATORY_CARE_PROVIDER_SITE_OTHER): Payer: BC Managed Care – PPO | Admitting: Obstetrics and Gynecology

## 2012-08-25 VITALS — BP 120/76 | Ht 62.0 in | Wt 115.0 lb

## 2012-08-25 DIAGNOSIS — B009 Herpesviral infection, unspecified: Secondary | ICD-10-CM

## 2012-08-25 DIAGNOSIS — Z01419 Encounter for gynecological examination (general) (routine) without abnormal findings: Secondary | ICD-10-CM

## 2012-08-25 LAB — CBC WITH DIFFERENTIAL/PLATELET
Basophils Absolute: 0 10*3/uL (ref 0.0–0.1)
Eosinophils Absolute: 0 10*3/uL (ref 0.0–0.7)
Eosinophils Relative: 0 % (ref 0–5)
HCT: 35 % — ABNORMAL LOW (ref 36.0–46.0)
MCH: 31.4 pg (ref 26.0–34.0)
MCV: 90.9 fL (ref 78.0–100.0)
Monocytes Absolute: 0.8 10*3/uL (ref 0.1–1.0)
Platelets: 373 10*3/uL (ref 150–400)
RDW: 12.9 % (ref 11.5–15.5)

## 2012-08-25 MED ORDER — NORGESTIM-ETH ESTRAD TRIPHASIC 0.18/0.215/0.25 MG-35 MCG PO TABS: ORAL_TABLET | ORAL | Status: DC

## 2012-08-25 MED ORDER — VALACYCLOVIR HCL 1 G PO TABS: 1000.0000 mg | ORAL_TABLET | Freq: Every day | ORAL | Status: DC

## 2012-08-25 MED ORDER — ZOLPIDEM TARTRATE 10 MG PO TABS: 10.0000 mg | ORAL_TABLET | Freq: Every evening | ORAL | Status: DC | PRN

## 2012-08-25 NOTE — Patient Instructions (Signed)
Schedule mammogram.

## 2012-08-25 NOTE — Progress Notes (Signed)
Patient came to see me today for her annual GYN exam. She remains on continuous active birth control pills for cycle control. She has not been sexually active since we last saw her. STD testing then  was negative and she declined retesting today. She will occasionally stop her pills and then will have bleeding. She is having no pelvic pain. She is due for her mammogram. She notices periodic redness of her vulvar. It'll come  and go within an hour. She uses Vaseline but is very short-lived. She has been doing laser removal of pubic hair but previously used frequent shaving. She will take Valtrex when she gets fever Blister breakouts. She requires daily Ambien for sleep which we  Provide. She does well with that without side effects. In 1987 she had cryosurgery for CIN. She has had normal Pap smears since then. Her last Pap smear was 2012. Her lipid profile has been normal here in 20 11 and 20/2012.  Physical examination:Sara Camacho present. HEENT within normal limits. Neck: Thyroid not large. No masses. Supraclavicular nodes: not enlarged. Breasts: Examined in both sitting and lying  position. No skin changes and no masses. Status post augmentation mammoplasty. Abdomen: Soft no guarding rebound or masses or hernia. Pelvic: External: Within normal limits With no lesions. Skin does appear redder than normal. BUS: Within normal limits. Vaginal:within normal limits. Good estrogen effect. No evidence of cystocele rectocele or enterocele. No vaginal discharge. Cervix: clean. Uterus: Normal size and shape. Adnexa: No masses. Rectovaginal exam: Confirmatory and negative. Extremities: Within normal limits.  Assessment: #1. History of CIN-1. #2. HSV 1. #3. Sensitive skin with occasional redness of vulva #4. Sleep disturbance  Plan: Continue [name] taking active pills continuously.The new Pap smear guidelines were discussed with the patient. Pap not done. Refilled Valtrex. Refilled Ambien. Continue yearly  mammograms.

## 2012-08-26 LAB — URINALYSIS W MICROSCOPIC + REFLEX CULTURE
Casts: NONE SEEN
Crystals: NONE SEEN
Glucose, UA: NEGATIVE mg/dL
Leukocytes, UA: NEGATIVE
Nitrite: NEGATIVE
Specific Gravity, Urine: 1.009 (ref 1.005–1.030)
Squamous Epithelial / LPF: NONE SEEN
pH: 6 (ref 5.0–8.0)

## 2012-09-03 ENCOUNTER — Emergency Department (HOSPITAL_COMMUNITY): Payer: BC Managed Care – PPO

## 2012-09-03 ENCOUNTER — Encounter (HOSPITAL_COMMUNITY): Payer: Self-pay | Admitting: Emergency Medicine

## 2012-09-03 ENCOUNTER — Emergency Department (HOSPITAL_COMMUNITY)
Admission: EM | Admit: 2012-09-03 | Discharge: 2012-09-03 | Disposition: A | Discharge status: Home / Self Care | Payer: BC Managed Care – PPO | Attending: Emergency Medicine | Admitting: Emergency Medicine

## 2012-09-03 DIAGNOSIS — Z79899 Other long term (current) drug therapy: Secondary | ICD-10-CM | POA: Insufficient documentation

## 2012-09-03 DIAGNOSIS — IMO0002 Reserved for concepts with insufficient information to code with codable children: Secondary | ICD-10-CM | POA: Insufficient documentation

## 2012-09-03 DIAGNOSIS — D069 Carcinoma in situ of cervix, unspecified: Secondary | ICD-10-CM | POA: Insufficient documentation

## 2012-09-03 DIAGNOSIS — T148XXA Other injury of unspecified body region, initial encounter: Secondary | ICD-10-CM

## 2012-09-03 DIAGNOSIS — Y939 Activity, unspecified: Secondary | ICD-10-CM | POA: Insufficient documentation

## 2012-09-03 DIAGNOSIS — G4700 Insomnia, unspecified: Secondary | ICD-10-CM | POA: Insufficient documentation

## 2012-09-03 DIAGNOSIS — F411 Generalized anxiety disorder: Secondary | ICD-10-CM | POA: Insufficient documentation

## 2012-09-03 DIAGNOSIS — M546 Pain in thoracic spine: Secondary | ICD-10-CM | POA: Insufficient documentation

## 2012-09-03 MED ORDER — HYDROCODONE-ACETAMINOPHEN 5-325 MG PO TABS
1.0000 | ORAL_TABLET | Freq: Once | ORAL | Status: AC
  Administered 2012-09-03: 1 via ORAL
  Filled 2012-09-03: qty 1

## 2012-09-03 NOTE — ED Notes (Signed)
LSB and head blocks removed by Henrene Dodge, PA.
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
## 2012-09-03 NOTE — ED Notes (Signed)
Pt is alert and oriented but slightly lethargic and seems to be slightly altered. Her responses are slow and she has trouble following commands and needs to be reminded frequently.

## 2012-09-04 NOTE — ED Provider Notes (Signed)
Medical screening examination/treatment/procedure(s) were performed by non-physician practitioner and as supervising physician I was immediately available for consultation/collaboration.  Jones Skene, M.D.     Jones Skene, MD 09/04/12 [phone]

## 2012-09-08 ENCOUNTER — Ambulatory Visit (INDEPENDENT_AMBULATORY_CARE_PROVIDER_SITE_OTHER): Payer: BC Managed Care – PPO | Admitting: Family Medicine

## 2012-09-08 ENCOUNTER — Encounter: Payer: Self-pay | Admitting: Family Medicine

## 2012-09-08 VITALS — BP 110/58 | Temp 98.2°F | Wt 116.0 lb

## 2012-09-08 DIAGNOSIS — M549 Dorsalgia, unspecified: Secondary | ICD-10-CM

## 2012-09-08 DIAGNOSIS — M546 Pain in thoracic spine: Secondary | ICD-10-CM

## 2012-09-08 DIAGNOSIS — R079 Chest pain, unspecified: Secondary | ICD-10-CM

## 2012-09-08 DIAGNOSIS — R0781 Pleurodynia: Secondary | ICD-10-CM

## 2012-09-08 DIAGNOSIS — M545 Low back pain, unspecified: Secondary | ICD-10-CM

## 2012-09-08 MED ORDER — CYCLOBENZAPRINE HCL 10 MG PO TABS: 10.0000 mg | ORAL_TABLET | Freq: Three times a day (TID) | ORAL | Status: DC | PRN

## 2012-09-08 MED ORDER — HYDROCODONE-ACETAMINOPHEN 5-325 MG PO TABS: 1.0000 | ORAL_TABLET | Freq: Four times a day (QID) | ORAL | Status: DC | PRN
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
## 2012-12-04 ENCOUNTER — Other Ambulatory Visit: Payer: Self-pay | Admitting: Family Medicine

## 2013-03-01 ENCOUNTER — Other Ambulatory Visit: Payer: Self-pay | Admitting: Obstetrics and Gynecology

## 2013-03-02 ENCOUNTER — Other Ambulatory Visit: Payer: Self-pay | Admitting: Obstetrics and Gynecology

## 2013-03-02 NOTE — Telephone Encounter (Signed)
Former patient of Dr. Timoteo Expose. Current with [facility].  At 08/25/12 CE Dr. Reece Agar wrote "She requires daily Ambien for sleep which we Provide.":

## 2013-03-06 ENCOUNTER — Encounter: Payer: Self-pay | Admitting: Family Medicine

## 2013-03-06 ENCOUNTER — Ambulatory Visit (INDEPENDENT_AMBULATORY_CARE_PROVIDER_SITE_OTHER): Payer: BC Managed Care – PPO | Admitting: Family Medicine

## 2013-03-06 VITALS — BP 90/58 | Temp 98.4°F | Wt 113.0 lb

## 2013-03-06 DIAGNOSIS — R238 Other skin changes: Secondary | ICD-10-CM

## 2013-03-06 DIAGNOSIS — L988 Other specified disorders of the skin and subcutaneous tissue: Secondary | ICD-10-CM

## 2013-03-06 MED ORDER — CEPHALEXIN 500 MG PO CAPS: 500.0000 mg | ORAL_CAPSULE | Freq: Three times a day (TID) | ORAL | Status: DC

## 2013-03-06 NOTE — Progress Notes (Signed)
  Subjective:    Patient ID: Sara Camacho, female    DOB: 1965/11/02, 47 y.o.   MRN: [id number]  HPI Acute visit Patient has some small erythematous papules on face which came up recently. She noticed some prominent veins beneath the right eye and a few prominent telangiectasias and used some makeup try to cover this up. She thinks this may have triggered the erythematous papules. No pustules. Previously used Keflex which worked well and requesting refills. Rash is nonpainful. No recent fevers or chills  Past Medical History  Diagnosis Date  . INSOMNIA, CHRONIC 02/15/2009  . BACK PAIN, UPPER 02/15/2009  . CERVICAL LYMPHADENOPATHY 02/15/2009  . ANXIETY, SITUATIONAL 11/27/2010  . CIN I (cervical intraepithelial neoplasia I)   . Anxiety    Past Surgical History  Procedure Laterality Date  . Augmentation mammaplasty      augmentation-removal-reinsertion  . Eyebrow lift    . Colposcopy    . Gynecologic cryosurgery      reports that she has never smoked. She does not have any smokeless tobacco history on file. She reports that  drinks alcohol. Her drug history is not on file. family history includes Breast cancer in her maternal grandmother and Cancer in her father and mother. Allergies  Allergen Reactions  . Penicillins Other (See Comments)    Childhood history      Review of Systems  Constitutional: Negative for fever and chills.  Skin: Positive for rash.       Objective:   Physical Exam  Constitutional: She appears well-developed and well-nourished.  Cardiovascular: Normal rate and regular rhythm.   Pulmonary/Chest: Effort normal and breath sounds normal. No respiratory distress. She has no wheezes. She has no rales.  Skin:  Patient has a few slightly prominent veins beneath right eye. These are non-dilated. She has a couple very small telangiectasias on the face. No concerning lesions.  She has a few nonspecific erythematous papules almost for head. No pustules           Assessment & Plan:  Patient presents with nonspecific papules face. Avoid excessive use of makeup. Refill Keflex 500 mg 3 times a day for 7 days as needed. Reassurance regarding slightly prominent facial veins. These are cosmetic only. We explained these would likely have to be seen by plastic surgeon but might not be covered by insurance because of cosmetic issues

## 2013-07-30 ENCOUNTER — Other Ambulatory Visit: Payer: Self-pay | Admitting: Obstetrics and Gynecology

## 2013-08-29 ENCOUNTER — Other Ambulatory Visit: Payer: Self-pay | Admitting: Gynecology

## 2013-08-31 NOTE — Telephone Encounter (Signed)
Pt has annual scheduled on Nov. 13

## 2013-08-31 NOTE — Telephone Encounter (Signed)
Informed pharmacy with the below note as well.

## 2013-09-01 ENCOUNTER — Telehealth: Payer: Self-pay | Admitting: *Deleted

## 2013-09-01 MED ORDER — ZOLPIDEM TARTRATE 10 MG PO TABS: 10.0000 mg | ORAL_TABLET | Freq: Every evening | ORAL | Status: DC | PRN

## 2013-09-01 NOTE — Telephone Encounter (Signed)
rx called in, left on voicemail rx sent.

## 2013-09-01 NOTE — Telephone Encounter (Signed)
Ambien #30 no refill

## 2013-09-01 NOTE — Telephone Encounter (Signed)
Pt Rx for Ambien 10 mg was denied recently, pt has annual schedule on 09/10/13. Per 08/25/12 note pt takes for sleep disturbance. Please advise

## 2013-09-10 ENCOUNTER — Ambulatory Visit (INDEPENDENT_AMBULATORY_CARE_PROVIDER_SITE_OTHER): Payer: BC Managed Care – PPO | Admitting: Gynecology

## 2013-09-10 ENCOUNTER — Telehealth: Payer: Self-pay | Admitting: *Deleted

## 2013-09-10 ENCOUNTER — Encounter: Payer: Self-pay | Admitting: Gynecology

## 2013-09-10 VITALS — BP 112/66 | Ht 63.0 in | Wt 118.0 lb

## 2013-09-10 DIAGNOSIS — G4700 Insomnia, unspecified: Secondary | ICD-10-CM

## 2013-09-10 DIAGNOSIS — Z01419 Encounter for gynecological examination (general) (routine) without abnormal findings: Secondary | ICD-10-CM

## 2013-09-10 DIAGNOSIS — B009 Herpesviral infection, unspecified: Secondary | ICD-10-CM

## 2013-09-10 DIAGNOSIS — B001 Herpesviral vesicular dermatitis: Secondary | ICD-10-CM

## 2013-09-10 LAB — COMPREHENSIVE METABOLIC PANEL
ALT: 9 U/L (ref 0–35)
Albumin: 3.8 g/dL (ref 3.5–5.2)
CO2: 24 mEq/L (ref 19–32)
Calcium: 8.8 mg/dL (ref 8.4–10.5)
Chloride: 102 mEq/L (ref 96–112)
Potassium: 4.1 mEq/L (ref 3.5–5.3)
Sodium: 136 mEq/L (ref 135–145)
Total Bilirubin: 0.7 mg/dL (ref 0.3–1.2)
Total Protein: 7.1 g/dL (ref 6.0–8.3)

## 2013-09-10 LAB — LIPID PANEL
Cholesterol: 198 mg/dL (ref 0–200)
HDL: 58 mg/dL (ref >39)
VLDL: 32 mg/dL (ref 0–40)

## 2013-09-10 LAB — CBC WITH DIFFERENTIAL/PLATELET
Basophils Absolute: 0 10*3/uL (ref 0.0–0.1)
Basophils Relative: 0 % (ref 0–1)
Eosinophils Absolute: 0.1 10*3/uL (ref 0.0–0.7)
Eosinophils Relative: 1 % (ref 0–5)
HCT: 36.2 % (ref 36.0–46.0)
Hemoglobin: 12.3 g/dL (ref 12.0–15.0)
Lymphocytes Relative: 24 % (ref 12–46)
Lymphs Abs: 1.9 10*3/uL (ref 0.7–4.0)
MCH: 30.8 pg (ref 26.0–34.0)
MCHC: 34 g/dL (ref 30.0–36.0)
MCV: 90.5 fL (ref 78.0–100.0)
Monocytes Absolute: 0.7 10*3/uL (ref 0.1–1.0)
Monocytes Relative: 8 % (ref 3–12)
Neutro Abs: 5.4 10*3/uL (ref 1.7–7.7)
Neutrophils Relative %: 67 % (ref 43–77)
Platelets: 377 10*3/uL (ref 150–400)
RBC: 4 MIL/uL (ref 3.87–5.11)
RDW: 12.9 % (ref 11.5–15.5)
WBC: 8 10*3/uL (ref 4.0–10.5)

## 2013-09-10 LAB — THYROID PANEL
Free T4: 1.03 ng/dL (ref 0.80–1.80)
T4, Total: 8.5 ug/dL (ref 5.0–12.5)
TSH: 1.638 u[IU]/mL (ref 0.350–4.500)

## 2013-09-10 MED ORDER — TRETINOIN 0.05 % EX CREA: 1.0000 "application " | TOPICAL_CREAM | Freq: Every day | CUTANEOUS | Status: DC

## 2013-09-10 MED ORDER — NORETHINDRONE ACET-ETHINYL EST 1-20 MG-MCG PO TABS: 1.0000 | ORAL_TABLET | Freq: Every day | ORAL | Status: DC

## 2013-09-10 MED ORDER — ZOLPIDEM TARTRATE 10 MG PO TABS: 10.0000 mg | ORAL_TABLET | Freq: Every evening | ORAL | Status: DC | PRN

## 2013-09-10 NOTE — Patient Instructions (Signed)
Start on new birth control pill. If breakthrough bleeding or post coital bleeding continues then call and we'll schedule an ultrasound and start an evaluation. Office will call you with the number for the sleep clinic. Followup in one year for annual exam.

## 2013-09-10 NOTE — Progress Notes (Signed)
Sara Camacho [birth year] [id number]        47 y.o.  G1P0010 for annual exam.  Former patient of Dr. Eda Paschal. Several issues noted below.  Past medical history,surgical history, problem list, medications, allergies, family history and social history were all reviewed and documented in the EPIC chart.  ROS:  Performed and pertinent positives and negatives are included in the history, assessment and plan .  Exam: Kim assistant Filed Vitals:   09/10/13 1034  BP: 112/66  Height: 5\' 3"  (1.6 m)  Weight: 118 lb (53.524 kg)   General appearance  Normal Skin grossly normal Head/Neck normal with no cervical or supraclavicular adenopathy thyroid normal Lungs  clear Cardiac RR, without RMG Abdominal  soft, nontender, without masses, organomegaly or hernia Breasts  examined lying and sitting without masses, retractions, discharge or axillary adenopathy. Bilateral implants noted Pelvic  Ext/BUS/vagina  normal  Cervix  normal  Uterus  anteverted, normal size, shape and contour, midline and mobile nontender   Adnexa  Without masses or tenderness    Anus and perineum  normal   Rectovaginal  normal sphincter tone without palpated masses or tenderness.    Assessment/Plan:  47 y.o. G70P0010 female for annual exam.   1. Continuous BCPs. Patient on a triphasic birth control pill continuously for menstrual suppression. Also for birth control and acne suppression. There is some spotting intermittently. Reviewed a triphasic that she may do this. Recommended switching to a monophasic pill to see how she does with this as well as decreasing the estrogen load. LoEstrin 120 equivalent prescribed. We'll use continuously at her choice. Risks of birth control pills with advancing age including stroke heart attack DVT discussed. Does not smoke otherwise healthy. Patient's comfortable with this. If bleeding continues despite switching to a monophasic pill I've asked her to call me and we will pursue an evaluation to  include an ultrasound. 2. Insomnia. Long history treated by Dr. Eda Paschal with Ambien 10 mg nightly. Sleep hygiene reviewed with her. She does not exercise I recommended she start doing so on a regular basis. Offered referral to the sleep clinic and she agreed with this. I did refill her Ambien 10 mg #30x5. 3. Herpes labialis. Uses Valtrex 2 g at initial onset. Has medicine at home but will call when she needs refills. 4. Retin A 0.05% cream. Uses intermittently for facial acne. Asked if I would refill this. She understands that I am not expert with this and it was originally prescribed by a dermatologist. I refilled this with 2 refills and my recommendation that she does followup with a dermatologist intermittently just to make sure this is appropriate to continue. 5. Pap smear 2012. No Pap smear done today. 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 Pap smears normal after this. Plan repeat Pap smear next year at 3 year interval. 6. Mammography 2012. Patient overdue and I reminded her of this. She agrees to schedule her mammogram. SBE monthly review. 7. Health maintenance. Baseline CBC comprehensive metabolic panel lipid profile urinalysis and thyroid panel due to her insomnia ordered. Followup one year, sooner as needed.  Note: This document was prepared with digital dictation and possible smart phrase technology. Any transcriptional errors that result from this process are unintentional.   Dara Lords MD, 11:11 AM 09/10/2013

## 2013-09-10 NOTE — Telephone Encounter (Signed)
Referral faxed to [facility].

## 2013-09-10 NOTE — Telephone Encounter (Signed)
Message copied by Aura Camps on Thu Sep 10, 2013  2:38 PM ------      Message from: Dara Lords      Created: Thu Sep 10, 2013 11:12 AM       Patient needs phone number for sleep clinic to set up insomnia evaluation ------

## 2013-09-11 ENCOUNTER — Telehealth: Payer: Self-pay | Admitting: *Deleted

## 2013-09-11 LAB — URINALYSIS W MICROSCOPIC + REFLEX CULTURE
Bilirubin Urine: NEGATIVE
Crystals: NONE SEEN
Specific Gravity, Urine: 1.02 (ref 1.005–1.030)
Squamous Epithelial / LPF: NONE SEEN
Urobilinogen, UA: 0.2 mg/dL (ref 0.0–1.0)

## 2013-09-11 NOTE — Telephone Encounter (Signed)
Pharmacy faxed prior authorization request for tretinoin 0.05% cream, form filled out and faxed to [facility] will wait for response.

## 2013-09-11 NOTE — Telephone Encounter (Signed)
Tretinoin cream approved via [employer] 09/11/13-09/10/15. Pharmacy will be informed

## 2013-09-11 NOTE — Telephone Encounter (Signed)
They will contact pt to schedule

## 2013-09-16 NOTE — Telephone Encounter (Signed)
Pt needed a consult appointment first so appointment was made a [facility] with Dr. Maple Hudson on 09/17/13 @ 10:00 am. I left message on pt voicemail to call me back asap in case we need to reschedule.

## 2013-09-16 NOTE — Telephone Encounter (Signed)
Pt never call back to regarding appointment tomorrow with Dr.Young, so I called the office and rescheduled appointment for 11/02/12 @ 10:45 am with Dr.Young. Pt can call to be placed on cancellation list if needed. I will try pt again tomorrow to inform with time and date.

## 2013-09-17 ENCOUNTER — Institutional Professional Consult (permissible substitution): Payer: BC Managed Care – PPO | Admitting: Internal Medicine

## 2013-09-23 ENCOUNTER — Telehealth: Payer: Self-pay

## 2013-09-23 NOTE — Telephone Encounter (Signed)
Patient called in voice mail asking was "this new medication different from the old one" because she is experience moodiness and wondered if it is the reason.  She also asked about how long she should wait before she schedules procedure for the bleeding.  She did state she would not be able to speak with me but asked me to leave her a detailed message in voice mail.  I called her back and in her voice mail explained that she had been on a triphasic oc pill and Dr Velvet Bathe changed her to a monophasic pill. I explained the difference and the hope that the regular consistent dose with the monophasic pill would settle down the bleeding she is having.  I explained that she has been on it such a short time that she could continue and see if the moodiness improves as her body adjusts to this new pill. I asked her to call me and let me know if the moodiness was so bad she found it intolerable and I will check with Dr. Velvet Bathe what to do.  I did tell her that Dr. Velvet Bathe wrote in his note that he wanted her to give the new pill a chance to see if it would help the bleeding and if it continue on with the new pill at that point he would want her to call and let him know so plan could be made.

## 2013-09-29 NOTE — Telephone Encounter (Signed)
I left message again for pt to call regarding appointment on 11/02/13.

## 2013-10-01 ENCOUNTER — Other Ambulatory Visit: Payer: Self-pay

## 2013-10-01 ENCOUNTER — Telehealth: Payer: Self-pay | Admitting: *Deleted

## 2013-10-01 MED ORDER — NORETHINDRONE ACET-ETHINYL EST 1-20 MG-MCG PO TABS: 1.0000 | ORAL_TABLET | Freq: Every day | ORAL | Status: DC

## 2013-10-01 NOTE — Telephone Encounter (Signed)
Pt called to follow up from OV 09/10/13. Pt is calling c/o vaginal bleeding since August heavy to light off and on. Per note "If bleeding continues despite switching to a monophasic pill I've asked her to call me and we will pursue an evaluation to include an ultrasound." please advise

## 2013-10-01 NOTE — Telephone Encounter (Signed)
I would recommend staying on the new birth control pill for another month or so. I would take it 3 weeks on one-week off for menses can do it intermittently like this for several cycles. Assuming she has control of her bleeding then she can go back to continuous pill use. I think it is a little early now to intervene as she is not been a month switching over to the new pills. If the irregular bleeding would continue into next month then will schedule sonohysterogram.

## 2013-10-01 NOTE — Telephone Encounter (Signed)
Pt informed with the below note. 

## 2013-10-02 NOTE — Telephone Encounter (Signed)
Pt is at work and unable to answer phone,I left all the below on her voicemail as directed by pt.

## 2013-10-05 ENCOUNTER — Ambulatory Visit (INDEPENDENT_AMBULATORY_CARE_PROVIDER_SITE_OTHER): Payer: BC Managed Care – PPO | Admitting: Family Medicine

## 2013-10-05 ENCOUNTER — Encounter: Payer: Self-pay | Admitting: Family Medicine

## 2013-10-05 VITALS — BP 98/70 | Temp 99.2°F | Wt 116.0 lb

## 2013-10-05 DIAGNOSIS — J069 Acute upper respiratory infection, unspecified: Secondary | ICD-10-CM

## 2013-10-05 MED ORDER — HYDROCODONE-HOMATROPINE 5-1.5 MG/5ML PO SYRP: 5.0000 mL | ORAL_SOLUTION | Freq: Three times a day (TID) | ORAL | Status: DC | PRN

## 2013-10-05 NOTE — Patient Instructions (Signed)

## 2013-10-05 NOTE — Progress Notes (Signed)
Pre visit review using our clinic review tool, if applicable. No additional management support is needed unless otherwise documented below in the visit note. 

## 2013-10-05 NOTE — Progress Notes (Signed)
Chief Complaint  Patient presents with  . Cough    fever of 101.     HPI:  -started: -symptoms:nasal congestion, sore throat, cough - had fever of 101 several days ago, only low grade temp since -denies:SOB, NVD, tooth pain, sinus pain -has tried: nothing -sick contacts/travel/risks: denies flu exposure, tick exposure or or Ebola risks -folks at work with similar symptoms  ROS: See pertinent positives and negatives per HPI.  Past Medical History  Diagnosis Date  . INSOMNIA, CHRONIC 02/15/2009  . BACK PAIN, UPPER 02/15/2009  . CERVICAL LYMPHADENOPATHY 02/15/2009  . ANXIETY, SITUATIONAL 11/27/2010  . Anxiety     Past Surgical History  Procedure Laterality Date  . Augmentation mammaplasty      augmentation-removal-reinsertion  . Eyebrow lift    . Gynecologic cryosurgery  1987    Family History  Problem Relation Age of Onset  . Cancer Father     colon  . Breast cancer Maternal Grandmother     Age 85's  . Cancer Mother     Lymphoma    History   Social History  . Marital Status: Single    Spouse Name: N/A    Number of Children: N/A  . Years of Education: N/A   Social History Main Topics  . Smoking status: Never Smoker   . Smokeless tobacco: None  . Alcohol Use: Yes     Comment: rare  . Drug Use: No  . Sexual Activity: Yes    Birth Control/ Protection: Pill   Other Topics Concern  . None   Social History Narrative  . None    Current outpatient prescriptions:HYDROcodone-acetaminophen (NORCO/VICODIN) 5-325 MG per tablet, Take 1 tablet by mouth every 6 (six) hours as needed for pain., Disp: 30 tablet, Rfl: 0;  hydrocortisone valerate cream (WESTCORT) 0.2 %, Apply 1 application topically as needed. On face per dermaatologist, Disp: , Rfl: ;  Multiple Vitamin (MULITIVITAMIN WITH MINERALS) TABS, Take 1 tablet by mouth daily., Disp: , Rfl:  tretinoin (RETIN-A) 0.05 % cream, Apply 1 application topically daily., Disp: 45 g, Rfl: 2;  triamcinolone cream (KENALOG) 0.1  %, Apply 1 application topically as needed. , Disp: , Rfl: ;  valACYclovir (VALTREX) 1000 MG tablet, Take 1,000 mg by mouth as needed. , Disp: , Rfl: ;  zolpidem (AMBIEN) 10 MG tablet, Take 1 tablet (10 mg total) by mouth at bedtime as needed for sleep., Disp: 30 tablet, Rfl: 5 Calcium-Phosphorus-Vitamin D (CALCIUM GUMMIES PO), Take 2 each by mouth daily., Disp: , Rfl: ;  HYDROcodone-homatropine (HYCODAN) 5-1.5 MG/5ML syrup, Take 5 mLs by mouth every 8 (eight) hours as needed for cough., Disp: 120 mL, Rfl: 0;  TRINESSA, 28, 0.18/0.215/0.25 MG-35 MCG tablet, , Disp: , Rfl:   EXAM:  Filed Vitals:   10/05/13 1555  BP: 98/70  Temp: 99.2 F (37.3 C)    Body mass index is 20.55 kg/(m^2).  GENERAL: vitals reviewed and listed above, alert, oriented, appears well hydrated and in no acute distress  HEENT: atraumatic, conjunttiva clear, no obvious abnormalities on inspection of external nose and ears, normal appearance of ear canals and TMs, clear nasal congestion, mild post oropharyngeal erythema with PND, no tonsillar edema or exudate, no sinus TTP  NECK: no obvious masses on inspection  LUNGS: clear to auscultation bilaterally, no wheezes, rales or rhonchi, good air movement  CV: HRRR, no peripheral edema  MS: moves all extremities without noticeable abnormality  PSYCH: pleasant and cooperative, no obvious depression or anxiety  ASSESSMENT AND PLAN:  Discussed the following assessment and plan:  Upper respiratory infection - Plan: HYDROcodone-homatropine (HYCODAN) 5-1.5 MG/5ML syrup  -given HPI and exam findings today, a serious infection or illness is unlikely. Fever has trended down and now afebrile. We discussed potential etiologies, with VURI being most likely, and advised supportive care and monitoring. We discussed treatment side effects, likely course, antibiotic misuse, transmission, and signs of developing a serious illness. -of course, we advised to return or notify a doctor  immediately if symptoms worsen or persist or new concerns arise.    Patient Instructions  INSTRUCTIONS FOR UPPER RESPIRATORY INFECTION:  -plenty of rest and fluids  -nasal saline wash 2-3 times daily (use prepackaged nasal saline or bottled/distilled water if making your own)   -can use sinex nasal spray for drainage and nasal congestion - but do NOT use longer then 3-4 days  -can use tylenol or ibuprofen as directed for aches and sorethroat  -in the winter time, using a humidifier at night is helpful (please follow cleaning instructions)  -if you are taking a cough medication - use only as directed, may also try a teaspoon of honey to coat the throat and throat lozenges  -for sore throat, salt water gargles can help  -follow up if you have fevers, facial pain, tooth pain, difficulty breathing or are worsening or not getting better in 5-7 days      KIM, HANNAH R.

## 2013-10-26 ENCOUNTER — Telehealth: Payer: Self-pay | Admitting: *Deleted

## 2013-10-26 NOTE — Telephone Encounter (Signed)
Pt called back requesting to switch back to old pills? And c/o still having some bleeding. I can never get pt on phone to speak with her I left a message that OV best with TF since I can never speak with patient via phone.

## 2013-10-27 ENCOUNTER — Telehealth: Payer: Self-pay | Admitting: *Deleted

## 2013-10-27 MED ORDER — NORETHINDRONE ACET-ETHINYL EST 1-20 MG-MCG PO TABS: 1.0000 | ORAL_TABLET | Freq: Every day | ORAL | Status: DC

## 2013-10-27 NOTE — Telephone Encounter (Signed)
She can switch back to the old pill. If she is willing to put up with occasional spotting then that's okay. It would take longer to get used to the new pill but if she is feeling that bad and I would suggest switching. Also if she truly is feeling suicidal then she needs to present to the emergency room for evaluation and care.  If it is more melancholy and I would suggest following up with her primary physician to consider long-term medication management.

## 2013-10-27 NOTE — Telephone Encounter (Signed)
Left the below on pt voicmail as her request. rx sent.

## 2013-10-27 NOTE — Telephone Encounter (Signed)
Pt calling to follow up from telephone encounter 10/01/13. Pt would like to start back on old birth control pills still having some bleeding light to medium flow at times but has never stopped. Pt said she feels very moody, depressed felt suicidal over christmas. I read your recommendation on 10/01/13 "I would take it 3 weeks on one-week off for menses can do it intermittently like this for several cycles. Assuming she has control of her bleeding then she can go back to continuous pill use." pt never tried this. Please advise

## 2013-11-02 ENCOUNTER — Institutional Professional Consult (permissible substitution): Payer: BC Managed Care – PPO | Admitting: Internal Medicine

## 2013-11-02 MED ORDER — NORGESTIM-ETH ESTRAD TRIPHASIC 0.18/0.215/0.25 MG-35 MCG PO TABS: 1.0000 | ORAL_TABLET | Freq: Every day | ORAL | Status: DC

## 2013-11-02 NOTE — Addendum Note (Signed)
Addended by: Aura CampsWEBB, Lynesha Bango L on: 11/02/2013 02:10 PM   Modules accepted: Orders

## 2013-11-02 NOTE — Telephone Encounter (Signed)
Pt called back today wrong rx was sent correct rx sent today

## 2013-11-26 ENCOUNTER — Ambulatory Visit (INDEPENDENT_AMBULATORY_CARE_PROVIDER_SITE_OTHER): Payer: BC Managed Care – PPO | Admitting: Gynecology

## 2013-11-26 ENCOUNTER — Encounter: Payer: Self-pay | Admitting: Gynecology

## 2013-11-26 DIAGNOSIS — N926 Irregular menstruation, unspecified: Secondary | ICD-10-CM

## 2013-11-26 LAB — CBC WITH DIFFERENTIAL/PLATELET
BASOS ABS: 0 10*3/uL (ref 0.0–0.1)
Basophils Relative: 0 % (ref 0–1)
Eosinophils Absolute: 0 10*3/uL (ref 0.0–0.7)
Eosinophils Relative: 0 % (ref 0–5)
HEMATOCRIT: 35.8 % — AB (ref 36.0–46.0)
Hemoglobin: 12 g/dL (ref 12.0–15.0)
LYMPHS ABS: 1.8 10*3/uL (ref 0.7–4.0)
LYMPHS PCT: 18 % (ref 12–46)
MCH: 29.9 pg (ref 26.0–34.0)
MCHC: 33.5 g/dL (ref 30.0–36.0)
MCV: 89.3 fL (ref 78.0–100.0)
MONO ABS: 0.6 10*3/uL (ref 0.1–1.0)
Monocytes Relative: 6 % (ref 3–12)
Neutro Abs: 7.6 10*3/uL (ref 1.7–7.7)
Neutrophils Relative %: 76 % (ref 43–77)
Platelets: 351 10*3/uL (ref 150–400)
RBC: 4.01 MIL/uL (ref 3.87–5.11)
RDW: 13.7 % (ref 11.5–15.5)
WBC: 10 10*3/uL (ref 4.0–10.5)

## 2013-11-26 NOTE — Progress Notes (Signed)
Patient presents complaining of 2 issues: 1. Irregular bleeding. Patient was seen November complaining of spotting intermittently. Was on triphasic control pills continuously for menstrual suppression. I switched her to Loestrin 120 equivalent. Over the last month or so she's bled on and off sometimes passing clots. Also did not feel as well saying she felt more depressed from a mood standpoint. She reinitiated her triphasics this past week and the irregular bleeding when away and she feels better. 2. Also notes sporadic chest pain lasting minutes burning in nature. Not associated with eating or exercising. No associated nausea or vomiting. No radiation or lightheadedness.  Exam was [employer] HEENT normal. Lungs clear bilaterally. Cardiac regular rate no rubs murmurs or gallops. Abdomen soft nontender without masses guarding rebound organomegaly. Pelvic external BUS vagina normal. Cervix normal. No bleeding. Uterus normal size midline mobile nontender. Adnexa without masses or tenderness.  Assessment and plan: 1. Irregular bleeding. Now resolved. Check baseline CBC and TSH. Options to include observation as she has stopped now and continuing on her triphasic versus sonohysterogram now to rule out nonpalpable abnormalities such as polyps. Ultimately we decided to proceed with ultrasound and she'll schedule this in followup for this. She'll continue on her triphasics for now. 2. Sporadic chest pain. I suspect this is reflux. Recommend trial of OTC antacids such as Tum's when she feels the discomfort. It is fleeting lasting just minutes. If she develops any other symptoms such as persistence, radiation, nausea/vomiting, lightheadedness she'll report this and we'll further evaluate or rule out cardiac or other etiologies such as reflux.

## 2013-11-26 NOTE — Patient Instructions (Signed)
Follow up for ultrasound as scheduled 

## 2013-11-27 LAB — TSH: TSH: 1.453 u[IU]/mL (ref 0.350–4.500)

## 2013-11-30 ENCOUNTER — Other Ambulatory Visit: Payer: Self-pay | Admitting: Gynecology

## 2013-11-30 DIAGNOSIS — N926 Irregular menstruation, unspecified: Secondary | ICD-10-CM

## 2013-12-03 ENCOUNTER — Other Ambulatory Visit: Payer: BC Managed Care – PPO

## 2013-12-03 ENCOUNTER — Ambulatory Visit: Payer: BC Managed Care – PPO | Admitting: Gynecology

## 2013-12-03 ENCOUNTER — Institutional Professional Consult (permissible substitution): Payer: BC Managed Care – PPO | Admitting: Pulmonary Disease

## 2013-12-23 ENCOUNTER — Ambulatory Visit: Payer: BC Managed Care – PPO | Admitting: Gynecology

## 2013-12-23 ENCOUNTER — Other Ambulatory Visit: Payer: BC Managed Care – PPO

## 2014-01-21 ENCOUNTER — Other Ambulatory Visit: Payer: Self-pay

## 2014-01-21 MED ORDER — TRETINOIN 0.025 % EX CREA: TOPICAL_CREAM | Freq: Every day | CUTANEOUS | Status: DC

## 2014-01-21 NOTE — Telephone Encounter (Signed)
Pharmacy sent a note saying patient is requesting 0.025% instead of the 0.05%.

## 2014-01-22 ENCOUNTER — Telehealth: Payer: Self-pay | Admitting: *Deleted

## 2014-01-22 NOTE — Telephone Encounter (Signed)
[facility]

## 2014-03-15 ENCOUNTER — Telehealth: Payer: Self-pay | Admitting: Family Medicine

## 2014-03-15 NOTE — Telephone Encounter (Signed)
Pt notified and i scheduled her for a f/u appt with her pcp in order for her to receive a refill for medication

## 2014-03-15 NOTE — Telephone Encounter (Signed)
Pt would like a refill of cephALEXin (KEFLEX) 500 MG capsule. Pt states she has acne that has gotten bad in the last few days. Dr Caryl Neverburchette will give this med to her when this happens. pls advise refill or does pt need appt New Pharm:  [facility]/ [address]

## 2014-03-16 ENCOUNTER — Ambulatory Visit (INDEPENDENT_AMBULATORY_CARE_PROVIDER_SITE_OTHER): Payer: BC Managed Care – PPO | Admitting: Family Medicine

## 2014-03-16 ENCOUNTER — Encounter: Payer: Self-pay | Admitting: Family Medicine

## 2014-03-16 VITALS — BP 110/62 | HR 88 | Temp 98.4°F | Wt 117.0 lb

## 2014-03-16 DIAGNOSIS — L709 Acne, unspecified: Secondary | ICD-10-CM

## 2014-03-16 DIAGNOSIS — L708 Other acne: Secondary | ICD-10-CM

## 2014-03-16 MED ORDER — HYDROCODONE-ACETAMINOPHEN 5-325 MG PO TABS: 1.0000 | ORAL_TABLET | Freq: Four times a day (QID) | ORAL | Status: DC | PRN

## 2014-03-16 MED ORDER — CEPHALEXIN 500 MG PO CAPS: 500.0000 mg | ORAL_CAPSULE | Freq: Three times a day (TID) | ORAL | Status: DC
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
## 2014-03-16 NOTE — Progress Notes (Signed)
Pre visit review using our clinic review tool, if applicable. No additional management support is needed unless otherwise documented below in the visit note. 

## 2014-03-23 ENCOUNTER — Other Ambulatory Visit: Payer: Self-pay | Admitting: Gynecology

## 2014-03-23 NOTE — Telephone Encounter (Signed)
Called into pharmacy

## 2014-04-12 ENCOUNTER — Ambulatory Visit (INDEPENDENT_AMBULATORY_CARE_PROVIDER_SITE_OTHER): Payer: BC Managed Care – PPO | Admitting: Gynecology

## 2014-04-12 ENCOUNTER — Telehealth: Payer: Self-pay | Admitting: *Deleted

## 2014-04-12 ENCOUNTER — Encounter: Payer: Self-pay | Admitting: Gynecology

## 2014-04-12 VITALS — BP 110/70

## 2014-04-12 DIAGNOSIS — N6320 Unspecified lump in the left breast, unspecified quadrant: Secondary | ICD-10-CM

## 2014-04-12 DIAGNOSIS — N63 Unspecified lump in unspecified breast: Secondary | ICD-10-CM

## 2014-04-12 NOTE — Progress Notes (Signed)
   48 year old patient presented to the office today stating that over the past 3 days she noted a left breast mass. Patient denied any nipple discharge. Patient denied any recent trauma. Patient does have a grandmother and great grandmother on her mother's side had breast cancer. Patient has had breast implants which she had to get replaced once in the past 10 years because of the leakage. Patient does not recall which side. Patient's last mammogram was in 2012 and it was described at the sides the implants that her breast for dense.  Exam: Both breasts are examined sitting supine position. Right breast no palpable mass or tenderness no supraclavicular axillary lymphadenopathy. Left breast on palpation she was found to have a 1-1/2-2 cm mobile nodule at the 6:00 position 1 fingerbreadth from the areolar region. There was no supraclavicular or axillary lymphadenopathy on that side.  Assessment/plan: Patient with left breast mass noted on self breast exam confirmed on clinical exam today. Patient with history of implants not sure if it's saline or silicon. Patient will be sent for a diagnostic mammogram of the left breast and screening mammogram of the right. We'll wait for the interpretation to manage accordingly. Patient is currently on oral contraceptive pill.

## 2014-04-12 NOTE — Telephone Encounter (Signed)
Message copied by Aura CampsWEBB, JENNIFER L on Mon Apr 12, 2014  2:25 PM ------      Message from: Ok EdwardsFERNANDEZ, JUAN H      Created: Mon Apr 12, 2014 12:27 PM       Victorino DikeJennifer, patient will need a diagnostic mammogram of the left breast for a palpated mass. Patient will need screening mammogram on the right breast.            Left breast on palpation she was found to have a 1-1/2-2 cm mobile nodule at the 6:00 position 1 fingerbreadth from the areolar region. ------

## 2014-04-12 NOTE — Telephone Encounter (Signed)
Orders placed at breast center they will contact patient to schedule.

## 2014-04-14 NOTE — Telephone Encounter (Signed)
appointment 04/26/14 @ 10:30 am

## 2014-04-15 ENCOUNTER — Other Ambulatory Visit: Payer: Self-pay | Admitting: Gynecology

## 2014-04-15 ENCOUNTER — Ambulatory Visit
Admission: RE | Admit: 2014-04-15 | Discharge: 2014-04-15 | Disposition: A | Discharge status: Home / Self Care | Payer: BC Managed Care – PPO | Source: Ambulatory Visit | Attending: Gynecology | Admitting: Gynecology

## 2014-04-15 DIAGNOSIS — N63 Unspecified lump in unspecified breast: Secondary | ICD-10-CM

## 2014-04-26 ENCOUNTER — Other Ambulatory Visit: Payer: BC Managed Care – PPO

## 2014-05-20 ENCOUNTER — Other Ambulatory Visit: Payer: Self-pay | Admitting: Dermatology

## 2014-06-24 ENCOUNTER — Telehealth: Payer: Self-pay | Admitting: *Deleted

## 2014-06-24 MED ORDER — ZOLPIDEM TARTRATE 10 MG PO TABS: ORAL_TABLET | ORAL | Status: DC

## 2014-06-24 MED ORDER — NORGESTIM-ETH ESTRAD TRIPHASIC 0.18/0.215/0.25 MG-35 MCG PO TABS: 1.0000 | ORAL_TABLET | Freq: Every day | ORAL | Status: DC

## 2014-06-24 NOTE — Telephone Encounter (Signed)
rx called in for Ambien, pt also needed a refill for birth control pill. rx sent.Marland Kitchen

## 2014-06-24 NOTE — Telephone Encounter (Signed)
Ambien 10 mg #30 refill x1 okay

## 2014-06-24 NOTE — Telephone Encounter (Signed)
Pt called requesting refill on Ambien 10 mg , annual due in Nov. Please advise

## 2014-08-06 ENCOUNTER — Encounter: Payer: Self-pay | Admitting: Family Medicine

## 2014-08-06 ENCOUNTER — Telehealth: Payer: Self-pay | Admitting: Family Medicine

## 2014-08-06 ENCOUNTER — Ambulatory Visit (INDEPENDENT_AMBULATORY_CARE_PROVIDER_SITE_OTHER): Payer: BC Managed Care – PPO | Admitting: Family Medicine

## 2014-08-06 VITALS — BP 110/70 | HR 90 | Temp 97.6°F | Wt 114.0 lb

## 2014-08-06 DIAGNOSIS — Z23 Encounter for immunization: Secondary | ICD-10-CM

## 2014-08-06 DIAGNOSIS — R220 Localized swelling, mass and lump, head: Secondary | ICD-10-CM

## 2014-08-06 NOTE — Progress Notes (Signed)
   Subjective:    Patient ID: Sara Camacho, female    DOB: 01/05/1966, 48 y.o.   MRN: [id number]  HPI Patient seen with bilateral facial swelling. Recent history is that Wednesday, 2 days ago, she went to some center for laser therapy for telangiectasias in her face. She noted swelling that night and states that this morning her eyes were basically swollen shut. Her edema has improved through the day. She has not had any fever or new redness or warmth. She took antihistamine with Benadryl and has been using some cold compresses. She called back the center where she had the laser treatment and they told her this should go down within a few days.  Past Medical History  Diagnosis Date  . INSOMNIA, CHRONIC 02/15/2009  . BACK PAIN, UPPER 02/15/2009  . CERVICAL LYMPHADENOPATHY 02/15/2009  . ANXIETY, SITUATIONAL 11/27/2010  . Anxiety    Past Surgical History  Procedure Laterality Date  . Augmentation mammaplasty      augmentation-removal-reinsertion  . Eyebrow lift    . Gynecologic cryosurgery  1987    reports that she has never smoked. She does not have any smokeless tobacco history on file. She reports that she drinks alcohol. She reports that she does not use illicit drugs. family history includes Breast cancer in her maternal grandmother; Cancer in her father and mother. Allergies  Allergen Reactions  . Penicillins Other (See Comments)    Childhood history      Review of Systems  Constitutional: Negative for fever and chills.  Respiratory: Negative for cough and shortness of breath.   Neurological: Negative for dizziness and headaches.       Objective:   Physical Exam  Constitutional: She appears well-developed and well-nourished.  HENT:  Mouth/Throat: Oropharynx is clear and moist.  Patient has some diffuse bilateral facial swelling mostly involving the cheek region. No erythema or warmth. Nontender. No angioedema of the lips  Neck: Neck supple.  Lymphadenopathy:    She has no  cervical adenopathy.          Assessment & Plan:  Bilateral facial swelling. Presumably related to recent laser treatment. She does not have any evidence for secondary infection. She will continue with cool compresses and she is strongly encouraged followup with laser Center if edema not improving over the next couple of days

## 2014-08-06 NOTE — Telephone Encounter (Signed)
Patient Information:  Caller Name: Jonita Albeeden  Phone: [phone]  Patient: Sara Camacho, Sara Camacho  Gender: Female  DOB: 11/23/1965  Age: 48 Years  PCP: Evelena PeatBurchette, [facility](Family Practice)  Pregnant: No  Office Follow Up:  Does the office need to follow up with this patient?: No  Instructions For The Office: N/A  RN Note:  Patient calling regarding Mild to moderated facial swelling after Laser treatment of cheek for removal of small vein of cheek.  Denies redness or fever.  States "I expected some swelling, but I want to make sure this is normal."  Currently using ice pack & benadryl and admits swelling is better today.  Requesting to be seen today after 14:00  Symptoms  Reason For Call & Symptoms: swelling of face after laser  Reviewed Health History In EMR: Yes  Reviewed Medications In EMR: Yes  Reviewed Allergies In EMR: Yes  Reviewed Surgeries / Procedures: Yes  Date of Onset of Symptoms: 08/04/2014  Treatments Tried: benadryl & ice pack  Treatments Tried Worked: Yes OB / GYN:  LMP: Unknown  Guideline(s) Used:  Face Swelling  Disposition Per Guideline:   See Today in Office  Reason For Disposition Reached:   Patient wants to be seen  Advice Given:  Call Back If  You become worse.  Patient Will Follow Care Advice:  YES  Appointment Scheduled:  08/06/2014 15:45:19 Appointment Scheduled Provider:  Evelena PeatBurchette, [facility](Family Practice)

## 2014-08-06 NOTE — Patient Instructions (Signed)
Follow up for any fever or increased redness.   

## 2014-08-06 NOTE — Progress Notes (Signed)
Pre visit review using our clinic review tool, if applicable. No additional management support is needed unless otherwise documented below in the visit note. 

## 2014-08-30 ENCOUNTER — Encounter: Payer: Self-pay | Admitting: Family Medicine

## 2014-09-17 ENCOUNTER — Other Ambulatory Visit: Payer: Self-pay | Admitting: Gynecology

## 2014-11-05 ENCOUNTER — Ambulatory Visit (INDEPENDENT_AMBULATORY_CARE_PROVIDER_SITE_OTHER): Payer: BLUE CROSS/BLUE SHIELD | Admitting: Gynecology

## 2014-11-05 ENCOUNTER — Other Ambulatory Visit (HOSPITAL_COMMUNITY)
Admission: RE | Admit: 2014-11-05 | Discharge: 2014-11-05 | Disposition: A | Discharge status: Home / Self Care | Payer: BLUE CROSS/BLUE SHIELD | Source: Ambulatory Visit | Attending: Gynecology | Admitting: Gynecology

## 2014-11-05 ENCOUNTER — Encounter: Payer: Self-pay | Admitting: Gynecology

## 2014-11-05 VITALS — BP 112/74 | Ht 63.0 in | Wt 114.0 lb

## 2014-11-05 DIAGNOSIS — Z01419 Encounter for gynecological examination (general) (routine) without abnormal findings: Secondary | ICD-10-CM

## 2014-11-05 DIAGNOSIS — Z1151 Encounter for screening for human papillomavirus (HPV): Secondary | ICD-10-CM | POA: Diagnosis present

## 2014-11-05 MED ORDER — NORGESTIM-ETH ESTRAD TRIPHASIC 0.18/0.215/0.25 MG-35 MCG PO TABS: 1.0000 | ORAL_TABLET | Freq: Every day | ORAL | Status: DC

## 2014-11-05 MED ORDER — ZOLPIDEM TARTRATE 10 MG PO TABS: ORAL_TABLET | ORAL | Status: DC

## 2014-11-05 NOTE — Progress Notes (Signed)
Sara Camacho 01/18/1966 [id number]        49 y.o.  G1P0010 for annual exam.  Doing well. Several issues noted below.  Past medical history,surgical history, problem list, medications, allergies, family history and social history were all reviewed and documented as reviewed in the EPIC chart.  ROS:  Performed with pertinent positives and negatives included in the history, assessment and plan.   Additional significant findings :  none   Exam: [facility] personassistant Filed Vitals:   11/05/14 1044  BP: 112/74  Height: 5\' 3"  (1.6 m)  Weight: 114 lb (51.71 kg)   General appearance:  Normal affect, orientation and appearance. Skin: Grossly normal HEENT: Without gross lesions.  No cervical or supraclavicular adenopathy. Thyroid normal.  Lungs:  Clear without wheezing, rales or rhonchi Cardiac: RR, without RMG Abdominal:  Soft, nontender, without masses, guarding, rebound, organomegaly or hernia Breasts:  Examined lying and sitting without masses, retractions, discharge or axillary adenopathy.  Bilateral implants noted Pelvic:  Ext/BUS/vagina normal  Cervix normal with mild spotting noted. Pap/HPV  Uterus anteverted to axial, normal size, shape and contour, midline and mobile nontender   Adnexa  Without masses or tenderness    Anus and perineum  Normal   Rectovaginal  Normal sphincter tone without palpated masses or tenderness.    Assessment/Plan:  49 y.o. 781P0010 female for annual exam without menses, continuous oral contraceptives.   1. Patient continues on continuous oral contraceptives.  Doing well and wants to continue. She does have occasional breakthrough bleeding which is acceptable to her.She was having more irregularity last year we had talked about doing a sonohysterogram but she never followed up for this and her bleeding has resolved.  I again reviewed the risks of oral contraceptives to include increased risk of stroke heart attack DVT particularly with advancing age. Never smoker and  not being followed for any medical issues. Refill 1 year provided for [name].   2. Insomnia. Patient continues using Ambien 10 mg nightly. We discussed this in the past and she has done this for a long time and is comfortable continuing. Ambien 10 mg #30 with 4 refills provided. 3. Pap smear 2012. Pap smear/HPV done today. 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 Normal Pap smears since then. 4. Mammography 03/2014. Continue with annual mammography. SBE monthly reviewed. 5. History of herpes labialis. Uses Valtrex intermittently. Has supply at home but will call when she needs more. 6. I refilled Retin-A in the past but she does not need that now. 7. Health maintenance. Baseline CBC comprehensive metabolic panel lipid profile urinalysis ordered. Follow up in one year, sooner as needed.    Dara LordsFONTAINE,Emiko Osorto P MD, 11:18 AM 11/05/2014

## 2014-11-05 NOTE — Addendum Note (Signed)
Addended by: Dayna BarkerGARDNER, Sybrina Laning K on: 11/05/2014 11:28 AM   Modules accepted: Orders, SmartSet

## 2014-11-05 NOTE — Patient Instructions (Signed)
You may obtain a copy of any labs that were done today by logging onto MyChart as outlined in the instructions provided with your AVS (after visit summary). The office will not call with normal lab results but certainly if there are any significant abnormalities then we will contact you.   Health Maintenance, Female A healthy lifestyle and preventative care can promote health and wellness.  Maintain regular health, dental, and eye exams.  Eat a healthy diet. Foods like vegetables, fruits, whole grains, low-fat dairy products, and lean protein foods contain the nutrients you need without too many calories. Decrease your intake of foods high in solid fats, added sugars, and salt. Get information about a proper diet from your caregiver, if necessary.  Regular physical exercise is one of the most important things you can do for your health. Most adults should get at least 150 minutes of moderate-intensity exercise (any activity that increases your heart rate and causes you to sweat) each week. In addition, most adults need muscle-strengthening exercises on 2 or more days a week.   Maintain a healthy weight. The body mass index (BMI) is a screening tool to identify possible weight problems. It provides an estimate of body fat based on height and weight. Your caregiver can help determine your BMI, and can help you achieve or maintain a healthy weight. For adults 20 years and older:  A BMI below 18.5 is considered underweight.  A BMI of 18.5 to 24.9 is normal.  A BMI of 25 to 29.9 is considered overweight.  A BMI of 30 and above is considered obese.  Maintain normal blood lipids and cholesterol by exercising and minimizing your intake of saturated fat. Eat a balanced diet with plenty of fruits and vegetables. Blood tests for lipids and cholesterol should begin at age 61 and be repeated every 5 years. If your lipid or cholesterol levels are high, you are over 50, or you are a high risk for heart  disease, you may need your cholesterol levels checked more frequently.Ongoing high lipid and cholesterol levels should be treated with medicines if diet and exercise are not effective.  If you smoke, find out from your caregiver how to quit. If you do not use tobacco, do not start.  Lung cancer screening is recommended for adults aged 33 80 years who are at high risk for developing lung cancer because of a history of smoking. Yearly low-dose computed tomography (CT) is recommended for people who have at least a 30-pack-year history of smoking and are a current smoker or have quit within the past 15 years. A pack year of smoking is smoking an average of 1 pack of cigarettes a day for 1 year (for example: 1 pack a day for 30 years or 2 packs a day for 15 years). Yearly screening should continue until the smoker has stopped smoking for at least 15 years. Yearly screening should also be stopped for people who develop a health problem that would prevent them from having lung cancer treatment.  If you are pregnant, do not drink alcohol. If you are breastfeeding, be very cautious about drinking alcohol. If you are not pregnant and choose to drink alcohol, do not exceed 1 drink per day. One drink is considered to be 12 ounces (355 mL) of beer, 5 ounces (148 mL) of wine, or 1.5 ounces (44 mL) of liquor.  Avoid use of street drugs. Do not share needles with anyone. Ask for help if you need support or instructions about stopping  the use of drugs.  High blood pressure causes heart disease and increases the risk of stroke. Blood pressure should be checked at least every 1 to 2 years. Ongoing high blood pressure should be treated with medicines, if weight loss and exercise are not effective.  If you are 59 to 50 years old, ask your caregiver if you should take aspirin to prevent strokes.  Diabetes screening involves taking a blood sample to check your fasting blood sugar level. This should be done once every 3  years, after age 91, if you are within normal weight and without risk factors for diabetes. Testing should be considered at a younger age or be carried out more frequently if you are overweight and have at least 1 risk factor for diabetes.  Breast cancer screening is essential preventative care for women. You should practice "breast self-awareness." This means understanding the normal appearance and feel of your breasts and may include breast self-examination. Any changes detected, no matter how small, should be reported to a caregiver. Women in their 66s and 30s should have a clinical breast exam (CBE) by a caregiver as part of a regular health exam every 1 to 3 years. After age 101, women should have a CBE every year. Starting at age 100, women should consider having a mammogram (breast X-ray) every year. Women who have a family history of breast cancer should talk to their caregiver about genetic screening. Women at a high risk of breast cancer should talk to their caregiver about having an MRI and a mammogram every year.  Breast cancer gene (BRCA)-related cancer risk assessment is recommended for women who have family members with BRCA-related cancers. BRCA-related cancers include breast, ovarian, tubal, and peritoneal cancers. Having family members with these cancers may be associated with an increased risk for harmful changes (mutations) in the breast cancer genes BRCA1 and BRCA2. Results of the assessment will determine the need for genetic counseling and BRCA1 and BRCA2 testing.  The Pap test is a screening test for cervical cancer. Women should have a Pap test starting at age 57. Between ages 25 and 35, Pap tests should be repeated every 2 years. Beginning at age 37, you should have a Pap test every 3 years as long as the past 3 Pap tests have been normal. If you had a hysterectomy for a problem that was not cancer or a condition that could lead to cancer, then you no longer need Pap tests. If you are  between ages 50 and 76, and you have had normal Pap tests going back 10 years, you no longer need Pap tests. If you have had past treatment for cervical cancer or a condition that could lead to cancer, you need Pap tests and screening for cancer for at least 20 years after your treatment. If Pap tests have been discontinued, risk factors (such as a new sexual partner) need to be reassessed to determine if screening should be resumed. Some women have medical problems that increase the chance of getting cervical cancer. In these cases, your caregiver may recommend more frequent screening and Pap tests.  The human papillomavirus (HPV) test is an additional test that may be used for cervical cancer screening. The HPV test looks for the virus that can cause the cell changes on the cervix. The cells collected during the Pap test can be tested for HPV. The HPV test could be used to screen women aged 44 years and older, and should be used in women of any age  who have unclear Pap test results. After the age of 10, women should have HPV testing at the same frequency as a Pap test.  Colorectal cancer can be detected and often prevented. Most routine colorectal cancer screening begins at the age of 72 and continues through age 65. However, your caregiver may recommend screening at an earlier age if you have risk factors for colon cancer. On a yearly basis, your caregiver may provide home test kits to check for hidden blood in the stool. Use of a small camera at the end of a tube, to directly examine the colon (sigmoidoscopy or colonoscopy), can detect the earliest forms of colorectal cancer. Talk to your caregiver about this at age 53, when routine screening begins. Direct examination of the colon should be repeated every 5 to 10 years through age 77, unless early forms of pre-cancerous polyps or small growths are found.  Hepatitis C blood testing is recommended for all people born from [birth year] through 1966 and any  individual with known risks for hepatitis C.  Practice safe sex. Use condoms and avoid high-risk sexual practices to reduce the spread of sexually transmitted infections (STIs). Sexually active women aged 63 and younger should be checked for Chlamydia, which is a common sexually transmitted infection. Older women with new or multiple partners should also be tested for Chlamydia. Testing for other STIs is recommended if you are sexually active and at increased risk.  Osteoporosis is a disease in which the bones lose minerals and strength with aging. This can result in serious bone fractures. The risk of osteoporosis can be identified using a bone density scan. Women ages 44 and over and women at risk for fractures or osteoporosis should discuss screening with their caregivers. Ask your caregiver whether you should be taking a calcium supplement or vitamin D to reduce the rate of osteoporosis.  Menopause can be associated with physical symptoms and risks. Hormone replacement therapy is available to decrease symptoms and risks. You should talk to your caregiver about whether hormone replacement therapy is right for you.  Use sunscreen. Apply sunscreen liberally and repeatedly throughout the day. You should seek shade when your shadow is shorter than you. Protect yourself by wearing long sleeves, pants, a wide-brimmed hat, and sunglasses year round, whenever you are outdoors.  Notify your caregiver of new moles or changes in moles, especially if there is a change in shape or color. Also notify your caregiver if a mole is larger than the size of a pencil eraser.  Stay current with your immunizations. Document Released: 04/30/2011 Document Revised: 02/09/2013 Document Reviewed: 04/30/2011 [facility] Patient Information 2014 [place].

## 2014-11-08 LAB — CYTOLOGY - PAP

## 2014-12-17 ENCOUNTER — Ambulatory Visit (INDEPENDENT_AMBULATORY_CARE_PROVIDER_SITE_OTHER): Payer: BLUE CROSS/BLUE SHIELD | Admitting: Family Medicine

## 2014-12-17 ENCOUNTER — Encounter: Payer: Self-pay | Admitting: Family Medicine

## 2014-12-17 VITALS — BP 110/68 | HR 90 | Temp 97.7°F | Wt 115.0 lb

## 2014-12-17 DIAGNOSIS — M79642 Pain in left hand: Secondary | ICD-10-CM

## 2014-12-17 MED ORDER — CEPHALEXIN 500 MG PO CAPS: 500.0000 mg | ORAL_CAPSULE | Freq: Two times a day (BID) | ORAL | Status: DC

## 2014-12-17 NOTE — Progress Notes (Signed)
Pre visit review using our clinic review tool, if applicable. No additional management support is needed unless otherwise documented below in the visit note. 

## 2014-12-17 NOTE — Progress Notes (Signed)
   Subjective:    Patient ID: Sara Camacho, female    DOB: 01/05/1966, 49 y.o.   MRN: [id number]  HPI Patient seen with some pain involving her left third MCP joint. She states about 2 months ago she woke up one day and had some bruising and swelling over that area. She described purplish to dark-colored changes that sounded consistent with bruise. She did not recall any injury whatsoever. Her bruising subsided and she's had some mild color changes and continued tenderness in that region along with possibly some very mild swelling. She has not had any other joint pains. No history of inflammatory arthritis.  Past Medical History  Diagnosis Date  . INSOMNIA, CHRONIC 02/15/2009  . BACK PAIN, UPPER 02/15/2009  . CERVICAL LYMPHADENOPATHY 02/15/2009  . ANXIETY, SITUATIONAL 11/27/2010  . Anxiety    Past Surgical History  Procedure Laterality Date  . Augmentation mammaplasty      augmentation-removal-reinsertion  . Eyebrow lift    . Gynecologic cryosurgery  1987    reports that she has never smoked. She does not have any smokeless tobacco history on file. She reports that she drinks alcohol. She reports that she does not use illicit drugs. family history includes Breast cancer in her maternal grandmother; Cancer in her father and mother. Allergies  Allergen Reactions  . Penicillins Other (See Comments)    Childhood history      Review of Systems  Constitutional: Negative for fever and chills.  Musculoskeletal: Negative for arthralgias.  Skin: Negative for rash.  Hematological: Does not bruise/bleed easily.       Objective:   Physical Exam  Constitutional: She appears well-developed and well-nourished.  Cardiovascular: Normal rate and regular rhythm.   Pulmonary/Chest: Effort normal and breath sounds normal. No respiratory distress. She has no wheezes. She has no rales.  Musculoskeletal:  Left hand reveals no significant erythema or warmth. Very mild edema over the third MCP joint.  Very minimally tender to palpation. Full range of motion. No pustules. No palpable foreign body          Assessment & Plan:  Pain and minimal swelling left third MCP joint. She is describing what sounds like some spontaneous ecchymosis couple months ago and may of had a small hematoma but she is not have residual evidence for hematoma this time. Doubt inflammatory arthritis. No evidence for foreign body.  Check ANA, rheumatoid factor, CCP antibodies. Consider x-ray left hand

## 2014-12-20 LAB — RHEUMATOID FACTOR: Rhuematoid fact SerPl-aCnc: <10 IU/mL (ref <=14)

## 2014-12-21 LAB — CYCLIC CITRUL PEPTIDE ANTIBODY, IGG

## 2014-12-23 LAB — ANA: Anti Nuclear Antibody(ANA): POSITIVE — AB

## 2014-12-23 LAB — ANTI-NUCLEAR AB-TITER (ANA TITER): ANA TITER 1: NEGATIVE (ref <1:40)

## 2015-04-01 ENCOUNTER — Other Ambulatory Visit: Payer: Self-pay | Admitting: Gynecology

## 2015-04-01 NOTE — Telephone Encounter (Signed)
rx called in [facility]

## 2015-04-07 ENCOUNTER — Other Ambulatory Visit: Payer: Self-pay | Admitting: Family Medicine

## 2015-04-08 NOTE — Telephone Encounter (Signed)
Refill once 

## 2015-06-07 ENCOUNTER — Telehealth: Payer: Self-pay | Admitting: Family Medicine

## 2015-06-07 NOTE — Telephone Encounter (Signed)
Refill once 

## 2015-06-07 NOTE — Telephone Encounter (Signed)
Patient called in for RX refill on hydrocodone °

## 2015-06-07 NOTE — Telephone Encounter (Signed)
Last visit 12/17/14 Last refill 03/16/14 #30 0 refill

## 2015-06-08 MED ORDER — HYDROCODONE-ACETAMINOPHEN 5-325 MG PO TABS: 1.0000 | ORAL_TABLET | Freq: Four times a day (QID) | ORAL | Status: DC | PRN

## 2015-06-08 NOTE — Telephone Encounter (Signed)
Left message on [facility] that Rx is ready for pickup  

## 2015-06-14 ENCOUNTER — Other Ambulatory Visit: Payer: Self-pay

## 2015-06-14 DIAGNOSIS — Z1231 Encounter for screening mammogram for malignant neoplasm of breast: Secondary | ICD-10-CM

## 2015-06-23 ENCOUNTER — Ambulatory Visit
Admission: RE | Admit: 2015-06-23 | Discharge: 2015-06-23 | Disposition: A | Discharge status: Home / Self Care | Payer: BLUE CROSS/BLUE SHIELD | Source: Ambulatory Visit

## 2015-06-23 DIAGNOSIS — Z1231 Encounter for screening mammogram for malignant neoplasm of breast: Secondary | ICD-10-CM

## 2015-06-27 ENCOUNTER — Other Ambulatory Visit: Payer: Self-pay | Admitting: Gynecology

## 2015-06-27 DIAGNOSIS — R928 Other abnormal and inconclusive findings on diagnostic imaging of breast: Secondary | ICD-10-CM

## 2015-07-06 ENCOUNTER — Telehealth: Payer: Self-pay | Admitting: *Deleted

## 2015-07-06 ENCOUNTER — Ambulatory Visit
Admission: RE | Admit: 2015-07-06 | Discharge: 2015-07-06 | Disposition: A | Discharge status: Home / Self Care | Payer: BLUE CROSS/BLUE SHIELD | Source: Ambulatory Visit | Attending: Gynecology | Admitting: Gynecology

## 2015-07-06 ENCOUNTER — Other Ambulatory Visit: Payer: Self-pay | Admitting: Gynecology

## 2015-07-06 DIAGNOSIS — R928 Other abnormal and inconclusive findings on diagnostic imaging of breast: Secondary | ICD-10-CM

## 2015-07-06 DIAGNOSIS — R921 Mammographic calcification found on diagnostic imaging of breast: Secondary | ICD-10-CM

## 2015-07-06 NOTE — Telephone Encounter (Signed)
Vernona Rieger from the breast center called radiologist would like to schedule a left breast ultrasound, order placed in epic by breast center. verbal order given to proceed.

## 2015-08-26 ENCOUNTER — Ambulatory Visit (INDEPENDENT_AMBULATORY_CARE_PROVIDER_SITE_OTHER): Payer: BLUE CROSS/BLUE SHIELD | Admitting: Gynecology

## 2015-08-26 ENCOUNTER — Encounter: Payer: Self-pay | Admitting: Gynecology

## 2015-08-26 VITALS — BP 120/76

## 2015-08-26 DIAGNOSIS — N3000 Acute cystitis without hematuria: Secondary | ICD-10-CM

## 2015-08-26 DIAGNOSIS — J069 Acute upper respiratory infection, unspecified: Secondary | ICD-10-CM

## 2015-08-26 LAB — URINALYSIS W MICROSCOPIC + REFLEX CULTURE
Bilirubin Urine: NEGATIVE
Casts: NONE SEEN [LPF]
Crystals: NONE SEEN [HPF]
Glucose, UA: NEGATIVE
KETONES UR: NEGATIVE
NITRITE: NEGATIVE
Protein, ur: NEGATIVE
SPECIFIC GRAVITY, URINE: 1.025 (ref 1.001–1.035)
YEAST: NONE SEEN [HPF]
pH: 5.5 (ref 5.0–8.0)

## 2015-08-26 MED ORDER — CIPROFLOXACIN HCL 250 MG PO TABS: 250.0000 mg | ORAL_TABLET | Freq: Two times a day (BID) | ORAL | Status: DC

## 2015-08-26 NOTE — Patient Instructions (Signed)
Take the antibiotic pill twice daily for 7 days. Follow up if your symptoms persist, worsen or recur. 

## 2015-08-26 NOTE — Progress Notes (Signed)
Sara Camacho 05/16/1966 [id number]        49 y.o.  G1P0010 Presents with 1-2 day history of increasing dysuria and frequency. No urgency low back pain fever chills. No discharge or odor. Also notes several days of cough with periorbital tenderness. No sputum or sore throat.  No nausea vomiting diarrhea constipation. Has [place] the [place] and unable to leave and void regularly.  Past medical history,surgical history, problem list, medications, allergies, family history and social history were all reviewed and documented in the EPIC chart.  Directed ROS with pertinent positives and negatives documented in the history of present illness/assessment and plan.  Exam: Kim assistant Filed Vitals:   08/26/15 1430  BP: 120/76   General appearance:  Normal HEENT normal with minimal periorbital tenderness to pressure. No evidence of pharyngitis or cervical adenopathy. Lungs clear without rales or rhonchi. Cardiac regular rate no rubs murmurs or gallops. Abdomen soft nontender without masses guarding rebound organomegaly  Assessment/Plan:  49 y.o. G1P0010 with:  1. Symptoms and urinalysis consistent with UTI. Will cover with ciprofloxacin 250 mg twice a day with an extended course to cover #2. Follow up if symptoms persist, worsen or recur. 2. Symptoms to suggest URI with early sinus involvement. Will cover with ciprofloxacin as above. Patient is allergic to penicillin. Follow up if symptoms persist, worsen or recur.  Patient is due for her annual exam in January and will follow up for this.    Dara LordsFONTAINE,Brandis Wixted P MD, 2:46 PM 08/26/2015

## 2015-08-26 NOTE — Addendum Note (Signed)
Addended by: Dayna BarkerGARDNER, Karas Pickerill K on: 08/26/2015 02:59 PM   Modules accepted: Orders

## 2015-08-27 LAB — URINE CULTURE
COLONY COUNT: NO GROWTH
Organism ID, Bacteria: NO GROWTH

## 2015-08-30 ENCOUNTER — Other Ambulatory Visit: Payer: Self-pay | Admitting: Gynecology

## 2015-08-31 NOTE — Telephone Encounter (Signed)
Last filled in June with 5 refills

## 2015-08-31 NOTE — Telephone Encounter (Signed)
Rx called in 

## 2015-09-05 ENCOUNTER — Other Ambulatory Visit: Payer: Self-pay | Admitting: Gynecology

## 2015-09-23 ENCOUNTER — Other Ambulatory Visit: Payer: Self-pay | Admitting: Family Medicine

## 2015-09-30 ENCOUNTER — Encounter: Payer: Self-pay | Admitting: Family Medicine

## 2015-09-30 ENCOUNTER — Ambulatory Visit (INDEPENDENT_AMBULATORY_CARE_PROVIDER_SITE_OTHER): Payer: BLUE CROSS/BLUE SHIELD | Admitting: Family Medicine

## 2015-09-30 VITALS — BP 94/70 | HR 100 | Temp 97.8°F | Ht 63.0 in | Wt 110.8 lb

## 2015-09-30 DIAGNOSIS — L700 Acne vulgaris: Secondary | ICD-10-CM

## 2015-09-30 MED ORDER — CEPHALEXIN 500 MG PO CAPS: 500.0000 mg | ORAL_CAPSULE | Freq: Two times a day (BID) | ORAL | Status: DC

## 2015-09-30 NOTE — Progress Notes (Signed)
HPI:  Acute visit for:  Acne: -reports longstanding adult acne vulgaris of the face -reports has tried many regimens topical and oral including acutane -reports the only thing that she tolerates that works with an outbreak is 2 days of bid keflex - rxd by PCP in the past -here for refill -denies allergy to this medication -retinoids tend to dry face too much  ROS: See pertinent positives and negatives per HPI.  Past Medical History  Diagnosis Date  . INSOMNIA, CHRONIC 02/15/2009  . BACK PAIN, UPPER 02/15/2009  . CERVICAL LYMPHADENOPATHY 02/15/2009  . ANXIETY, SITUATIONAL 11/27/2010  . Anxiety     Past Surgical History  Procedure Laterality Date  . Augmentation mammaplasty      augmentation-removal-reinsertion  . Eyebrow lift    . Gynecologic cryosurgery  1987    Family History  Problem Relation Age of Onset  . Cancer Father     colon  . Breast cancer Maternal Grandmother     Age 77's  . Cancer Mother     Lymphoma    Social History   Social History  . Marital Status: Single    Spouse Name: N/A  . Number of Children: N/A  . Years of Education: N/A   Social History Main Topics  . Smoking status: Never Smoker   . Smokeless tobacco: None  . Alcohol Use: 0.0 oz/week    0 Standard drinks or equivalent per week     Comment: rare  . Drug Use: No  . Sexual Activity: Not Currently    Birth Control/ Protection: Pill     Comment: 1st intercourse 49 yo-Fewer than 5 partners   Other Topics Concern  . None   Social History Narrative     Current outpatient prescriptions:  .  HYDROcodone-acetaminophen (NORCO/VICODIN) 5-325 MG per tablet, Take 1 tablet by mouth every 6 (six) hours as needed., Disp: 30 tablet, Rfl: 0 .  hydrocortisone valerate cream (WESTCORT) 0.2 %, Apply 1 application topically as needed. On face per dermaatologist, Disp: , Rfl:  .  Multiple Vitamin (MULITIVITAMIN WITH MINERALS) TABS, Take 1 tablet by mouth daily., Disp: , Rfl:  .   Norgestimate-Ethinyl Estradiol Triphasic 0.18/0.215/0.25 MG-35 MCG tablet, TAKE ONE TABLET BY MOUTH ONE TIME DAILY FOR CONTINUOUS USE, Disp: 56 tablet, Rfl: 1 .  tretinoin (RETIN-A) 0.025 % cream, Apply topically at bedtime., Disp: 45 g, Rfl: 2 .  tretinoin (RETIN-A) 0.05 % cream, Apply 1 application topically daily., Disp: 45 g, Rfl: 2 .  valACYclovir (VALTREX) 1000 MG tablet, Take 1,000 mg by mouth as needed. , Disp: , Rfl:  .  zolpidem (AMBIEN) 10 MG tablet, TAKE 1 TABLET BY MOUTH AT BEDTIME AS NEEDED, Disp: 30 tablet, Rfl: 3 .  cephALEXin (KEFLEX) 500 MG capsule, Take 1 capsule (500 mg total) by mouth 2 (two) times daily., Disp: 20 capsule, Rfl: 0  EXAM:  Filed Vitals:   09/30/15 1108  BP: 94/70  Pulse: 100  Temp: 97.8 F (36.6 C)    Body mass index is 19.63 kg/(m^2).  GENERAL: vitals reviewed and listed above, alert, oriented, appears well hydrated and in no acute distress  HEENT: atraumatic, conjunttiva clear, no obvious abnormalities on inspection of external nose and ears  NECK: no obvious masses on inspection  SKIN: scattered erythematous papule and a few pustules on face  MS: moves all extremities without noticeable abnormality  PSYCH: pleasant and cooperative, no obvious depression or anxiety  ASSESSMENT AND PLAN:  Discussed the following assessment and plan:  Acne  vulgaris  -discussed treatment options for acne -suggested trial atralin gel - alt day dosing with acne moisterizer and topic HCT if drying - she may consider -refill of oral abx, risks discussed -follow up with PCP in 3 months, sooner if needed -she reported will be out of ambien in next few months and requested early refill - advised refill through PCP or gyn when needed since not out currently and appears refilled 08/31/15 with refills by gynecologist -Patient advised to return or notify a doctor immediately if symptoms worsen or persist or new concerns arise.  Patient Instructions  Consider  Atralin gel   Follow up with your primary doctor in 3 months     Sara Camacho, Sara [facility] LLCANNAH R.

## 2015-09-30 NOTE — Patient Instructions (Addendum)
Consider Atralin gel   Follow up with your primary doctor in 3 months

## 2015-09-30 NOTE — Progress Notes (Signed)
Pre visit review using our clinic review tool, if applicable. No additional management support is needed unless otherwise documented below in the visit note. 

## 2015-10-19 ENCOUNTER — Ambulatory Visit (INDEPENDENT_AMBULATORY_CARE_PROVIDER_SITE_OTHER): Payer: BLUE CROSS/BLUE SHIELD | Admitting: Family Medicine

## 2015-10-19 VITALS — BP 100/80 | HR 104 | Temp 97.6°F | Resp 16 | Ht 63.0 in | Wt 110.4 lb

## 2015-10-19 DIAGNOSIS — J209 Acute bronchitis, unspecified: Secondary | ICD-10-CM | POA: Diagnosis not present

## 2015-10-19 MED ORDER — CEPHALEXIN 500 MG PO CAPS: ORAL_CAPSULE | ORAL | Status: DC

## 2015-10-19 MED ORDER — BENZONATATE 200 MG PO CAPS: 200.0000 mg | ORAL_CAPSULE | Freq: Three times a day (TID) | ORAL | Status: DC | PRN

## 2015-10-19 NOTE — Patient Instructions (Signed)

## 2015-10-19 NOTE — Progress Notes (Signed)
Pre visit review using our clinic review tool, if applicable. No additional management support is needed unless otherwise documented below in the visit note. 

## 2015-10-19 NOTE — Progress Notes (Signed)
   Subjective:    Patient ID: Sara HancockEden G Cercone, female    DOB: 11/06/1965, 49 y.o.   MRN: [id number]  HPI Acute visit for bronchial illness Patient relates cough, sore throat, postnasal drip past few days No headaches. Denies any fevers or chills. Some mild body aches and increased malaise. Cough productive of green sputum. Nonsmoker. No chronic lung disease.  Past Medical History  Diagnosis Date  . INSOMNIA, CHRONIC 02/15/2009  . BACK PAIN, UPPER 02/15/2009  . CERVICAL LYMPHADENOPATHY 02/15/2009  . ANXIETY, SITUATIONAL 11/27/2010  . Anxiety    Past Surgical History  Procedure Laterality Date  . Augmentation mammaplasty      augmentation-removal-reinsertion  . Eyebrow lift    . Gynecologic cryosurgery  1987    reports that she has never smoked. She does not have any smokeless tobacco history on file. She reports that she drinks alcohol. She reports that she does not use illicit drugs. family history includes Breast cancer in her maternal grandmother; Cancer in her father and mother. Allergies  Allergen Reactions  . Penicillins Other (See Comments)    Childhood history      Review of Systems  Constitutional: Positive for fatigue. Negative for fever and chills.  HENT: Positive for congestion and sore throat.   Respiratory: Positive for cough.   Neurological: Negative for headaches.       Objective:   Physical Exam  Constitutional: She appears well-developed and well-nourished.  HENT:  Right Ear: External ear normal.  Left Ear: External ear normal.  Mouth/Throat: Oropharynx is clear and moist.  Neck: Neck supple.  Cardiovascular: Normal rate and regular rhythm.   Pulmonary/Chest: Effort normal and breath sounds normal. No respiratory distress. She has no wheezes. She has no rales.  Lymphadenopathy:    She has no cervical adenopathy.          Assessment & Plan:  Acute upper respiratory infection with cough. Suspect viral. Tessalon Perles 200 mg every 8 hours as need  for cough. Refilled her Keflex which she takes intermittently for perioral dermatitis.

## 2015-11-09 ENCOUNTER — Encounter: Payer: Self-pay | Admitting: Gynecology

## 2015-11-09 ENCOUNTER — Ambulatory Visit (INDEPENDENT_AMBULATORY_CARE_PROVIDER_SITE_OTHER): Payer: BLUE CROSS/BLUE SHIELD | Admitting: Gynecology

## 2015-11-09 VITALS — BP 116/76 | Ht 64.0 in | Wt 109.0 lb

## 2015-11-09 DIAGNOSIS — Z1329 Encounter for screening for other suspected endocrine disorder: Secondary | ICD-10-CM | POA: Diagnosis not present

## 2015-11-09 DIAGNOSIS — Z13220 Encounter for screening for lipoid disorders: Secondary | ICD-10-CM | POA: Diagnosis not present

## 2015-11-09 DIAGNOSIS — Z01419 Encounter for gynecological examination (general) (routine) without abnormal findings: Secondary | ICD-10-CM

## 2015-11-09 DIAGNOSIS — Z1321 Encounter for screening for nutritional disorder: Secondary | ICD-10-CM

## 2015-11-09 LAB — LIPID PANEL
Cholesterol: 214 mg/dL — ABNORMAL HIGH (ref 125–200)
HDL: 66 mg/dL (ref >=46)
LDL Cholesterol: 103 mg/dL (ref <130)
Total CHOL/HDL Ratio: 3.2 Ratio (ref <=5.0)
Triglycerides: 224 mg/dL — ABNORMAL HIGH (ref <150)
VLDL: 45 mg/dL — ABNORMAL HIGH (ref <30)

## 2015-11-09 LAB — COMPREHENSIVE METABOLIC PANEL
ALT: 9 U/L (ref 6–29)
AST: 12 U/L (ref 10–35)
Albumin: 4 g/dL (ref 3.6–5.1)
Alkaline Phosphatase: 32 U/L — ABNORMAL LOW (ref 33–130)
BUN: 15 mg/dL (ref 7–25)
CO2: 25 mmol/L (ref 20–31)
Calcium: 9.4 mg/dL (ref 8.6–10.4)
Chloride: 97 mmol/L — ABNORMAL LOW (ref 98–110)
Creat: 0.74 mg/dL (ref 0.50–1.05)
GLUCOSE: 73 mg/dL (ref 65–99)
Potassium: 4.4 mmol/L (ref 3.5–5.3)
Sodium: 132 mmol/L — ABNORMAL LOW (ref 135–146)
Total Bilirubin: 0.6 mg/dL (ref 0.2–1.2)
Total Protein: 7 g/dL (ref 6.1–8.1)

## 2015-11-09 LAB — CBC WITH DIFFERENTIAL/PLATELET
Basophils Absolute: 0 10*3/uL (ref 0.0–0.1)
Basophils Relative: 0 % (ref 0–1)
Eosinophils Absolute: 0 10*3/uL (ref 0.0–0.7)
Eosinophils Relative: 0 % (ref 0–5)
HCT: 38 % (ref 36.0–46.0)
HEMOGLOBIN: 12.8 g/dL (ref 12.0–15.0)
LYMPHS ABS: 2 10*3/uL (ref 0.7–4.0)
Lymphocytes Relative: 20 % (ref 12–46)
MCH: 31.1 pg (ref 26.0–34.0)
MCHC: 33.7 g/dL (ref 30.0–36.0)
MCV: 92.2 fL (ref 78.0–100.0)
MONOS PCT: 8 % (ref 3–12)
MPV: 9.9 fL (ref 8.6–12.4)
Monocytes Absolute: 0.8 10*3/uL (ref 0.1–1.0)
NEUTROS PCT: 72 % (ref 43–77)
Neutro Abs: 7.3 10*3/uL (ref 1.7–7.7)
Platelets: 441 10*3/uL — ABNORMAL HIGH (ref 150–400)
RBC: 4.12 MIL/uL (ref 3.87–5.11)
RDW: 13.1 % (ref 11.5–15.5)
WBC: 10.1 10*3/uL (ref 4.0–10.5)

## 2015-11-09 MED ORDER — VALACYCLOVIR HCL 1 G PO TABS: 500.0000 mg | ORAL_TABLET | Freq: Two times a day (BID) | ORAL | Status: DC

## 2015-11-09 MED ORDER — ZOLPIDEM TARTRATE 10 MG PO TABS: 10.0000 mg | ORAL_TABLET | Freq: Every evening | ORAL | Status: DC | PRN

## 2015-11-09 MED ORDER — NORGESTIM-ETH ESTRAD TRIPHASIC 0.18/0.215/0.25 MG-35 MCG PO TABS: ORAL_TABLET | ORAL | Status: DC

## 2015-11-09 NOTE — Progress Notes (Signed)
Sara Camacho 02/12/1966 [id number]        50 y.o.  tevertedG1P0010  for annual exam.  Several issues noted below.  Past medical history,surgical history, problem list, medications, allergies, family history and social history were all reviewed and documented as reviewed in the EPIC chart.  ROS:  Performed with pertinent positives and negatives included in the history, assessment and plan.   Additional significant findings :  none   Exam: Kennon PortelaKim Gardner assistant Filed Vitals:   11/09/15 1038  BP: 116/76  Height: 5\' 4"  (1.626 m)  Weight: 109 lb (49.442 kg)   General appearance:  Normal affect, orientation and appearance. Skin: Grossly normal HEENT: Without gross lesions.  No cervical or supraclavicular adenopathy. Thyroid normal.  Lungs:  Clear without wheezing, rales or rhonchi Cardiac: RR, without RMG Abdominal:  Soft, nontender, without masses, guarding, rebound, organomegaly or hernia Breasts:  Examined lying and sitting without masses, retractions, discharge or axillary adenopathy.  Bilateral implants noted Pelvic:  Ext/BUS/vagina normal  Cervix normal  Uterus anteverted, normal size, shape and contour, midline and mobile nontender   Adnexa  Without masses or tenderness    Anus and perineum  Normal   Rectovaginal  Normal sphincter tone without palpated masses or tenderness.    Assessment/Plan:  50 y.o. 391P0010 female for annual exam, without menses, continuous oral contraceptives.   1. Oral contraceptives.  Patient continues on continuous triphasic oral contraceptives. Is doing well with this and wants to continue. I discussed the whole issue of oral contraceptives at age 89. Never smoked and not being followed for medical issues. Has occasional spotting well-tolerated. Increased risk of stroke heart attack DVT with oral contraceptives with possible increased risk with age discussed. Patient understands and accepts and wants to continue for now. [facility]  and consider stopping the pills. Refill 1 year provided. 2. Insomnia. Patient uses Ambien 10 mg regularly and has done so for years with good results. #30 with 4 refills provided. 3. Intermittent HSV. Uses Valtrex twice daily for several days at the earliest onset of outbreak. Valtrex 500 mg #30 with 2 refills provided. 4. Pap smear/HPV negative 2016.  No Pap smear done today. 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 Plan repeat Pap smear in 3-5 year interval. 5. Mammography 05/2015. Continue with annual mammography when due. 6. Colonoscopy never. Recommended she move toward scheduling a screening colonoscopy as she has turned 49 and she agrees to do so. 7. Health maintenance. Patient requests baseline labs. CBC, comprehensive metabolic panel, lipid profile, TSH, vitamin D, urinalysis ordered. She asked about refilling her Retin-A and I recommend she follow up with her dermatologist in reference to this to make sure there is nothing new that they want to do.  Follow up in one year, sooner as needed.   Dara LordsFONTAINE,TIMOTHY P MD, 11:33 AM 11/09/2015

## 2015-11-09 NOTE — Patient Instructions (Addendum)
Schedule your colonoscopy with either:  [facility]   Address: [address], [place], [place] [address]  [phone]    or  [facility]  Address: [address], [place], [place] [address]  [phone]      You may obtain a copy of any labs that were done today by logging onto MyChart as outlined in the instructions provided with your AVS (after visit summary). The office will not call with normal lab results but certainly if there are any significant abnormalities then we will contact you.   Health Maintenance Adopting a healthy lifestyle and getting preventive care can go a long way to promote health and wellness. Talk with your health care provider about what schedule of regular examinations is right for you. This is a good chance for you to check in with your provider about disease prevention and staying healthy. In between checkups, there are plenty of things you can do on your own. Experts have done a lot of research about which lifestyle changes and preventive measures are most likely to keep you healthy. Ask your health care provider for more information. WEIGHT AND DIET  Eat a healthy diet  Be sure to include plenty of vegetables, fruits, low-fat dairy products, and lean protein.  Do not eat a lot of foods high in solid fats, added sugars, or salt.  Get regular exercise. This is one of the most important things you can do for your health.  Most adults should exercise for at least 150 minutes each week. The exercise should increase your heart rate and make you sweat (moderate-intensity exercise).  Most adults should also do strengthening exercises at least twice a week. This is in addition to the moderate-intensity exercise.  Maintain a healthy weight  Body mass index (BMI) is a measurement that can be used to identify possible weight problems. It estimates body fat based on height and weight. Your health care provider can help determine  your BMI and help you achieve or maintain a healthy weight.  For females 22 years of age and older:   A BMI below 18.5 is considered underweight.  A BMI of 18.5 to 24.9 is normal.  A BMI of 25 to 29.9 is considered overweight.  A BMI of 30 and above is considered obese.  Watch levels of cholesterol and blood lipids  You should start having your blood tested for lipids and cholesterol at 51 years of age, then have this test every 5 years.  You may need to have your cholesterol levels checked more often if:  Your lipid or cholesterol levels are high.  You are older than 51 years of age.  You are at high risk for heart disease.  CANCER SCREENING   Lung Cancer  Lung cancer screening is recommended for adults 19-51 years old who are at high risk for lung cancer because of a history of smoking.  A yearly low-dose CT scan of the lungs is recommended for people who:  Currently smoke.  Have quit within the past 15 years.  Have at least a 30-pack-year history of smoking. A pack year is smoking an average of one pack of cigarettes a day for 1 year.  Yearly screening should continue until it has been 15 years since you quit.  Yearly screening should stop if you develop a health problem that would prevent you from having lung cancer treatment.  Breast Cancer  Practice breast self-awareness. This means understanding how your breasts normally appear and  feel.  It also means doing regular breast self-exams. Let your health care provider know about any changes, no matter how small.  If you are in your 20s or 30s, you should have a clinical breast exam (CBE) by a health care provider every 1-3 years as part of a regular health exam.  If you are 35 or older, have a CBE every year. Also consider having a breast X-ray (mammogram) every year.  If you have a family history of breast cancer, talk to your health care provider about genetic screening.  If you are at high risk for breast  cancer, talk to your health care provider about having an MRI and a mammogram every year.  Breast cancer gene (BRCA) assessment is recommended for women who have family members with BRCA-related cancers. BRCA-related cancers include:  Breast.  Ovarian.  Tubal.  Peritoneal cancers.  Results of the assessment will determine the need for genetic counseling and BRCA1 and BRCA2 testing. Cervical Cancer Routine pelvic examinations to screen for cervical cancer are no longer recommended for nonpregnant women who are considered low risk for cancer of the pelvic organs (ovaries, uterus, and vagina) and who do not have symptoms. A pelvic examination may be necessary if you have symptoms including those associated with pelvic infections. Ask your health care provider if a screening pelvic exam is right for you.   The Pap test is the screening test for cervical cancer for women who are considered at risk.  If you had a hysterectomy for a problem that was not cancer or a condition that could lead to cancer, then you no longer need Pap tests.  If you are older than 65 years, and you have had normal Pap tests for the past 10 years, you no longer need to have Pap tests.  If you have had past treatment for cervical cancer or a condition that could lead to cancer, you need Pap tests and screening for cancer for at least 20 years after your treatment.  If you no longer get a Pap test, assess your risk factors if they change (such as having a new sexual partner). This can affect whether you should start being screened again.  Some women have medical problems that increase their chance of getting cervical cancer. If this is the case for you, your health care provider may recommend more frequent screening and Pap tests.  The human papillomavirus (HPV) test is another test that may be used for cervical cancer screening. The HPV test looks for the virus that can cause cell changes in the cervix. The cells  collected during the Pap test can be tested for HPV.  The HPV test can be used to screen women 72 years of age and older. Getting tested for HPV can extend the interval between normal Pap tests from three to five years.  An HPV test also should be used to screen women of any age who have unclear Pap test results.  After 50 years of age, women should have HPV testing as often as Pap tests.  Colorectal Cancer  This type of cancer can be detected and often prevented.  Routine colorectal cancer screening usually begins at 51 years of age and continues through 51 years of age.  Your health care provider may recommend screening at an earlier age if you have risk factors for colon cancer.  Your health care provider may also recommend using home test kits to check for hidden blood in the stool.  A small camera  at the end of a tube can be used to examine your colon directly (sigmoidoscopy or colonoscopy). This is done to check for the earliest forms of colorectal cancer.  Routine screening usually begins at age 40.  Direct examination of the colon should be repeated every 5-10 years through 51 years of age. However, you may need to be screened more often if early forms of precancerous polyps or small growths are found. Skin Cancer  Check your skin from head to toe regularly.  Tell your health care provider about any new moles or changes in moles, especially if there is a change in a mole's shape or color.  Also tell your health care provider if you have a mole that is larger than the size of a pencil eraser.  Always use sunscreen. Apply sunscreen liberally and repeatedly throughout the day.  Protect yourself by wearing long sleeves, pants, a wide-brimmed hat, and sunglasses whenever you are outside. HEART DISEASE, DIABETES, AND HIGH BLOOD PRESSURE   Have your blood pressure checked at least every 1-2 years. High blood pressure causes heart disease and increases the risk of stroke.  If  you are between 74 years and 72 years old, ask your health care provider if you should take aspirin to prevent strokes.  Have regular diabetes screenings. This involves taking a blood sample to check your fasting blood sugar level.  If you are at a normal weight and have a low risk for diabetes, have this test once every three years after 51 years of age.  If you are overweight and have a high risk for diabetes, consider being tested at a younger age or more often. PREVENTING INFECTION  Hepatitis B  If you have a higher risk for hepatitis B, you should be screened for this virus. You are considered at high risk for hepatitis B if:  You were born in a country where hepatitis B is common. Ask your health care provider which countries are considered high risk.  Your parents were born in a high-risk country, and you have not been immunized against hepatitis B (hepatitis B vaccine).  You have HIV or AIDS.  You use needles to inject street drugs.  You live with someone who has hepatitis B.  You have had sex with someone who has hepatitis B.  You get hemodialysis treatment.  You take certain medicines for conditions, including cancer, organ transplantation, and autoimmune conditions. Hepatitis C  Blood testing is recommended for:  Everyone born from [birth year] through 1966.  Anyone with known risk factors for hepatitis C. Sexually transmitted infections (STIs)  You should be screened for sexually transmitted infections (STIs) including gonorrhea and chlamydia if:  You are sexually active and are younger than 51 years of age.  You are older than 51 years of age and your health care provider tells you that you are at risk for this type of infection.  Your sexual activity has changed since you were last screened and you are at an increased risk for chlamydia or gonorrhea. Ask your health care provider if you are at risk.  If you do not have HIV, but are at risk, it may be recommended that  you take a prescription medicine daily to prevent HIV infection. This is called pre-exposure prophylaxis (PrEP). You are considered at risk if:  You are sexually active and do not regularly use condoms or know the HIV status of your partner(s).  You take drugs by injection.  You are sexually active with a partner  who has HIV. Talk with your health care provider about whether you are at high risk of being infected with HIV. If you choose to begin PrEP, you should first be tested for HIV. You should then be tested every 3 months for as long as you are taking PrEP.  PREGNANCY   If you are premenopausal and you may become pregnant, ask your health care provider about preconception counseling.  If you may become pregnant, take 400 to 800 micrograms (mcg) of folic acid every day.  If you want to prevent pregnancy, talk to your health care provider about birth control (contraception). OSTEOPOROSIS AND MENOPAUSE   Osteoporosis is a disease in which the bones lose minerals and strength with aging. This can result in serious bone fractures. Your risk for osteoporosis can be identified using a bone density scan.  If you are 8 years of age or older, or if you are at risk for osteoporosis and fractures, ask your health care provider if you should be screened.  Ask your health care provider whether you should take a calcium or vitamin D supplement to lower your risk for osteoporosis.  Menopause may have certain physical symptoms and risks.  Hormone replacement therapy may reduce some of these symptoms and risks. Talk to your health care provider about whether hormone replacement therapy is right for you.  HOME CARE INSTRUCTIONS   Schedule regular health, dental, and eye exams.  Stay current with your immunizations.   Do not use any tobacco products including cigarettes, chewing tobacco, or electronic cigarettes.  If you are pregnant, do not drink alcohol.  If you are breastfeeding, limit how  much and how often you drink alcohol.  Limit alcohol intake to no more than 1 drink per day for nonpregnant women. One drink equals 12 ounces of beer, 5 ounces of wine, or 1 ounces of hard liquor.  Do not use street drugs.  Do not share needles.  Ask your health care provider for help if you need support or information about quitting drugs.  Tell your health care provider if you often feel depressed.  Tell your health care provider if you have ever been abused or do not feel safe at home. Document Released: 04/30/2011 Document Revised: 03/01/2014 Document Reviewed: 09/16/2013 [facility] Patient Information 2015 [place], [place]. This information is not intended to replace advice given to you by your health care provider. Make sure you discuss any questions you have with your health care provider.

## 2015-11-10 LAB — URINALYSIS W MICROSCOPIC + REFLEX CULTURE
Bilirubin Urine: NEGATIVE
CRYSTALS: NONE SEEN [HPF]
Casts: NONE SEEN [LPF]
Glucose, UA: NEGATIVE
KETONES UR: NEGATIVE
Nitrite: NEGATIVE
Protein, ur: NEGATIVE
Specific Gravity, Urine: 1.023 (ref 1.001–1.035)
Yeast: NONE SEEN [HPF]
pH: 5.5 (ref 5.0–8.0)

## 2015-11-10 LAB — VITAMIN D 25 HYDROXY (VIT D DEFICIENCY, FRACTURES): VIT D 25 HYDROXY: 48 ng/mL (ref 30–100)

## 2015-11-10 LAB — TSH: TSH: 1.399 u[IU]/mL (ref 0.350–4.500)

## 2015-11-11 ENCOUNTER — Other Ambulatory Visit: Payer: Self-pay | Admitting: Gynecology

## 2015-11-11 DIAGNOSIS — E878 Other disorders of electrolyte and fluid balance, not elsewhere classified: Secondary | ICD-10-CM

## 2015-11-11 DIAGNOSIS — E782 Mixed hyperlipidemia: Secondary | ICD-10-CM

## 2015-11-11 DIAGNOSIS — E871 Hypo-osmolality and hyponatremia: Secondary | ICD-10-CM

## 2015-11-11 LAB — URINE CULTURE

## 2015-12-05 ENCOUNTER — Other Ambulatory Visit: Payer: Self-pay | Admitting: Gynecology

## 2015-12-19 ENCOUNTER — Other Ambulatory Visit: Payer: BLUE CROSS/BLUE SHIELD

## 2015-12-19 DIAGNOSIS — E878 Other disorders of electrolyte and fluid balance, not elsewhere classified: Secondary | ICD-10-CM

## 2015-12-19 DIAGNOSIS — E782 Mixed hyperlipidemia: Secondary | ICD-10-CM

## 2015-12-19 DIAGNOSIS — E871 Hypo-osmolality and hyponatremia: Secondary | ICD-10-CM

## 2015-12-19 LAB — ELECTROLYTE PANEL
CHLORIDE: 102 mmol/L (ref 98–110)
CO2: 22 mmol/L (ref 20–31)
POTASSIUM: 4 mmol/L (ref 3.5–5.3)
Sodium: 136 mmol/L (ref 135–146)

## 2015-12-19 LAB — LIPID PANEL
CHOLESTEROL: 209 mg/dL — AB (ref 125–200)
HDL: 66 mg/dL (ref >=46)
LDL CALC: 120 mg/dL (ref <130)
TRIGLYCERIDES: 116 mg/dL (ref <150)
Total CHOL/HDL Ratio: 3.2 Ratio (ref <=5.0)
VLDL: 23 mg/dL (ref <30)

## 2015-12-20 ENCOUNTER — Other Ambulatory Visit: Payer: Self-pay | Admitting: Gynecology

## 2015-12-20 DIAGNOSIS — E785 Hyperlipidemia, unspecified: Secondary | ICD-10-CM

## 2015-12-21 ENCOUNTER — Ambulatory Visit (INDEPENDENT_AMBULATORY_CARE_PROVIDER_SITE_OTHER): Payer: BLUE CROSS/BLUE SHIELD | Admitting: Family Medicine

## 2015-12-21 VITALS — BP 110/80 | HR 85 | Temp 97.9°F | Ht 64.0 in | Wt 111.4 lb

## 2015-12-21 DIAGNOSIS — R197 Diarrhea, unspecified: Secondary | ICD-10-CM

## 2015-12-21 DIAGNOSIS — R252 Cramp and spasm: Secondary | ICD-10-CM | POA: Diagnosis not present
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
## 2015-12-21 NOTE — Patient Instructions (Addendum)

## 2015-12-21 NOTE — Progress Notes (Signed)
Pre visit review using our clinic review tool, if applicable. No additional management support is needed unless otherwise documented below in the visit note. 

## 2016-04-04 ENCOUNTER — Encounter: Payer: Self-pay | Admitting: Gynecology

## 2016-04-04 ENCOUNTER — Ambulatory Visit (INDEPENDENT_AMBULATORY_CARE_PROVIDER_SITE_OTHER): Payer: BLUE CROSS/BLUE SHIELD | Admitting: Gynecology

## 2016-04-04 VITALS — BP 118/76

## 2016-04-04 DIAGNOSIS — Z113 Encounter for screening for infections with a predominantly sexual mode of transmission: Secondary | ICD-10-CM | POA: Diagnosis not present

## 2016-04-04 NOTE — Patient Instructions (Signed)
Office will call you with the test results 
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
## 2016-04-05 LAB — HIV ANTIBODY (ROUTINE TESTING W REFLEX): HIV 1&2 Ab, 4th Generation: NONREACTIVE

## 2016-04-05 LAB — HEPATITIS C ANTIBODY: HCV Ab: NEGATIVE

## 2016-05-15 ENCOUNTER — Ambulatory Visit (INDEPENDENT_AMBULATORY_CARE_PROVIDER_SITE_OTHER): Payer: BLUE CROSS/BLUE SHIELD | Admitting: Family Medicine

## 2016-05-15 VITALS — BP 100/70 | HR 91 | Temp 98.5°F | Ht 64.0 in | Wt 110.5 lb

## 2016-05-15 DIAGNOSIS — G4700 Insomnia, unspecified: Secondary | ICD-10-CM

## 2016-05-15 DIAGNOSIS — F418 Other specified anxiety disorders: Secondary | ICD-10-CM | POA: Diagnosis not present

## 2016-05-15 DIAGNOSIS — L710 Perioral dermatitis: Secondary | ICD-10-CM | POA: Diagnosis not present

## 2016-05-15 MED ORDER — CEPHALEXIN 500 MG PO CAPS: ORAL_CAPSULE | ORAL | Status: DC

## 2016-05-15 MED ORDER — LORAZEPAM 0.5 MG PO TABS: 0.5000 mg | ORAL_TABLET | Freq: Two times a day (BID) | ORAL | Status: DC | PRN

## 2016-05-15 NOTE — Patient Instructions (Signed)
Continue with counseling. 
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
## 2016-05-15 NOTE — Progress Notes (Signed)
Pre visit review using our clinic review tool, if applicable. No additional management support is needed unless otherwise documented below in the visit note. 

## 2016-05-28 ENCOUNTER — Other Ambulatory Visit: Payer: Self-pay | Admitting: Gynecology

## 2016-05-28 NOTE — Telephone Encounter (Signed)
Rx called in 

## 2016-05-28 NOTE — Telephone Encounter (Signed)
Last filled in Jan with 4 refills

## 2016-08-31 ENCOUNTER — Encounter: Payer: Self-pay | Admitting: Gynecology

## 2016-08-31 ENCOUNTER — Ambulatory Visit (INDEPENDENT_AMBULATORY_CARE_PROVIDER_SITE_OTHER): Payer: BLUE CROSS/BLUE SHIELD | Admitting: Gynecology

## 2016-08-31 VITALS — BP 120/76

## 2016-08-31 DIAGNOSIS — R634 Abnormal weight loss: Secondary | ICD-10-CM | POA: Diagnosis not present

## 2016-08-31 LAB — CBC WITH DIFFERENTIAL/PLATELET
BASOS PCT: 0 %
Basophils Absolute: 0 cells/uL (ref 0–200)
Eosinophils Absolute: 92 cells/uL (ref 15–500)
Eosinophils Relative: 1 %
HEMATOCRIT: 38 % (ref 35.0–45.0)
HEMOGLOBIN: 12.7 g/dL (ref 11.7–15.5)
LYMPHS ABS: 3036 {cells}/uL (ref 850–3900)
LYMPHS PCT: 33 %
MCH: 30.9 pg (ref 27.0–33.0)
MCHC: 33.4 g/dL (ref 32.0–36.0)
MCV: 92.5 fL (ref 80.0–100.0)
MONO ABS: 828 {cells}/uL (ref 200–950)
MPV: 9.6 fL (ref 7.5–12.5)
Monocytes Relative: 9 %
NEUTROS ABS: 5244 {cells}/uL (ref 1500–7800)
Neutrophils Relative %: 57 %
Platelets: 409 10*3/uL — ABNORMAL HIGH (ref 140–400)
RBC: 4.11 MIL/uL (ref 3.80–5.10)
RDW: 13.1 % (ref 11.0–15.0)
WBC: 9.2 10*3/uL (ref 3.8–10.8)

## 2016-08-31 MED ORDER — NORETHINDRONE ACET-ETHINYL EST 1-20 MG-MCG PO TABS: 1.0000 | ORAL_TABLET | Freq: Every day | ORAL | 11 refills | Status: DC

## 2016-08-31 NOTE — Patient Instructions (Signed)
Start on the new pills as we discussed. Call me in a month to let me know how you're doing.

## 2016-08-31 NOTE — Progress Notes (Signed)
    Sara Camacho 11/05/1965 [id number]        50 y.o.  G1P0010 presents complaining of moodiness with emotional swings as well as inability to gain weight. Patient says she eats a lot but does not seem to be able to gain any weight which she wants to gain several more pounds to look healthier. Has not lost any appreciable weight. No skin or hair changes. Actively sees a counselor for some anxiety issues. Is on continuous oral contraceptives and is wondering whether is related to this. She is on a triphasic.  Past medical history,surgical history, problem list, medications, allergies, family history and social history were all reviewed and documented in the EPIC chart.  Directed ROS with pertinent positives and negatives documented in the history of present illness/assessment and plan.  Exam: Vitals:   08/31/16 0841  BP: 120/76   General appearance:  Normal   Assessment/Plan:  50 y.o. G1P0010 with increasing moodiness.  Complaining that she is unable to gain weight although not losing any appreciable weight. Nothing else has significantly changed in her life as far as personal relationships or job related. We'll check baseline labs to include CBC company has a metabolic panel and thyroid panel. Will switch her pills to a monophasic Loestrin 1/20 equivalent. Will call me in one month to let me know how she's doing.    Dara LordsFONTAINE,Skyra Crichlow P MD, 8:53 AM 08/31/2016

## 2016-09-01 LAB — COMPREHENSIVE METABOLIC PANEL
ALK PHOS: 30 U/L — AB (ref 33–130)
ALT: 7 U/L (ref 6–29)
AST: 11 U/L (ref 10–35)
Albumin: 3.9 g/dL (ref 3.6–5.1)
BILIRUBIN TOTAL: 0.6 mg/dL (ref 0.2–1.2)
BUN: 17 mg/dL (ref 7–25)
CALCIUM: 9.2 mg/dL (ref 8.6–10.4)
CO2: 23 mmol/L (ref 20–31)
Chloride: 103 mmol/L (ref 98–110)
Creat: 0.81 mg/dL (ref 0.50–1.05)
GLUCOSE: 84 mg/dL (ref 65–99)
Potassium: 4.1 mmol/L (ref 3.5–5.3)
Sodium: 137 mmol/L (ref 135–146)
TOTAL PROTEIN: 7 g/dL (ref 6.1–8.1)

## 2016-09-01 LAB — THYROID PROFILE - CHCC
FREE THYROXINE INDEX: 2.2 (ref 1.4–3.8)
T3 Uptake: 29 % (ref 22–35)
T4, Total: 7.6 ug/dL (ref 4.5–12.0)

## 2016-09-06 ENCOUNTER — Telehealth: Payer: Self-pay | Admitting: *Deleted

## 2016-09-06 NOTE — Telephone Encounter (Signed)
Pt came back for repeat CBC and CMET on 08/31/16 saw results on my chart and noticed her platelets were still elevated as it was in on 11/09/15 along alkaline phosphatase. Asked if anything should be done about this? Please advise

## 2016-09-07 NOTE — Telephone Encounter (Signed)
Patient called. Informed. 

## 2016-09-07 NOTE — Telephone Encounter (Signed)
#  1 alkaline phosphatase is actually low which is normal #2 platelets are just out of the normal range which is not unusual and considered a normal variant. Anything less than 500,000 is considered okay

## 2016-09-07 NOTE — Telephone Encounter (Signed)
Left message to call.

## 2016-09-19 ENCOUNTER — Ambulatory Visit (INDEPENDENT_AMBULATORY_CARE_PROVIDER_SITE_OTHER): Payer: BLUE CROSS/BLUE SHIELD | Admitting: Family Medicine

## 2016-09-19 VITALS — BP 100/64 | HR 96 | Temp 98.0°F | Ht 64.0 in | Wt 112.2 lb

## 2016-09-19 DIAGNOSIS — F339 Major depressive disorder, recurrent, unspecified: Secondary | ICD-10-CM | POA: Diagnosis not present

## 2016-09-19 MED ORDER — CEPHALEXIN 500 MG PO CAPS: 500.0000 mg | ORAL_CAPSULE | Freq: Three times a day (TID) | ORAL | 0 refills | Status: DC

## 2016-09-19 MED ORDER — ESCITALOPRAM OXALATE 10 MG PO TABS: 10.0000 mg | ORAL_TABLET | Freq: Every day | ORAL | 5 refills | Status: DC

## 2016-09-19 MED ORDER — TRETINOIN 0.025 % EX CREA: TOPICAL_CREAM | Freq: Every day | CUTANEOUS | 2 refills | Status: DC

## 2016-09-19 NOTE — Progress Notes (Signed)
Pre visit review using our clinic review tool, if applicable. No additional management support is needed unless otherwise documented below in the visit note. 

## 2016-09-19 NOTE — Progress Notes (Signed)
Subjective:     Patient ID: Sara Camacho, female   DOB: 02/25/1966, 50 y.o.   MRN: [id number]  HPI Patient seen with concern for depression. She's been seeing a therapist for quite some time. She's had very difficult year. She's had some challenging relationships. She is currently not happy with her job. She has frequent early morning awakening. Frequent crying spells. Denies any active suicidal ideation though she states she's had some fleeting thoughts. Her therapist felt that she should be on antidepressant medication. She has history of some chronic anxiety but has never been treated for depression previously. She has low motivation. Sometimes has feelings of hopelessness. Very poor support as she does not have very large family. Her mother lives here but they don't interact much.  Past Medical History:  Diagnosis Date  . Anxiety   . ANXIETY, SITUATIONAL 11/27/2010  . BACK PAIN, UPPER 02/15/2009  . CERVICAL LYMPHADENOPATHY 02/15/2009  . INSOMNIA, CHRONIC 02/15/2009   Past Surgical History:  Procedure Laterality Date  . AUGMENTATION MAMMAPLASTY     augmentation-removal-reinsertion  . Eyebrow lift    . GYNECOLOGIC CRYOSURGERY  1987    reports that she has never smoked. She does not have any smokeless tobacco history on file. She reports that she drinks alcohol. She reports that she does not use drugs. family history includes Breast cancer in her maternal grandmother; Cancer in her father and mother. Allergies  Allergen Reactions  . Penicillins Other (See Comments)    Childhood history     Review of Systems  Constitutional: Negative for appetite change.  Respiratory: Negative for shortness of breath.   Cardiovascular: Negative for chest pain.  Psychiatric/Behavioral: Positive for dysphoric mood and sleep disturbance. Negative for agitation, confusion and self-injury. The patient is nervous/anxious.        Objective:   Physical Exam  Constitutional: She appears well-developed and  well-nourished.  Neck: Neck supple.  Cardiovascular: Normal rate and regular rhythm.   Pulmonary/Chest: Effort normal and breath sounds normal. No respiratory distress. She has no wheezes. She has no rales.  Musculoskeletal: She exhibits no edema.  Psychiatric:  Patient is alert and cooperative. Slightly depressed mood. Clear thought process.       Assessment:     Major depressive episode    Plan:     -Continue with regular counseling -Recommend start Lexapro 10 mg once daily -Follow-up in 3 weeks to assess and sooner as needed -Follow-up immediately for any suicidal ideation or medication side effects or other concerns  Kristian CoveyBruce W Burchette MD [facility]

## 2016-09-28 ENCOUNTER — Telehealth: Payer: Self-pay

## 2016-09-28 NOTE — Telephone Encounter (Signed)
PA approved. Form faxed back to pharmacy. 

## 2016-09-28 NOTE — Telephone Encounter (Signed)
Received PA request for Tretinoin cream. PA submitted & is pending. [id number]

## 2016-10-10 ENCOUNTER — Ambulatory Visit: Payer: BLUE CROSS/BLUE SHIELD | Admitting: Family Medicine

## 2016-10-19 ENCOUNTER — Other Ambulatory Visit: Payer: Self-pay | Admitting: Gynecology

## 2016-10-19 NOTE — Telephone Encounter (Signed)
Called into pharmacy

## 2016-11-12 ENCOUNTER — Other Ambulatory Visit: Payer: Self-pay | Admitting: Gynecology

## 2016-11-12 NOTE — Telephone Encounter (Signed)
Called into pharmacy

## 2016-11-26 ENCOUNTER — Encounter: Payer: Self-pay | Admitting: Gynecology

## 2016-11-26 ENCOUNTER — Ambulatory Visit (INDEPENDENT_AMBULATORY_CARE_PROVIDER_SITE_OTHER): Payer: BLUE CROSS/BLUE SHIELD | Admitting: Gynecology

## 2016-11-26 VITALS — BP 120/76 | Ht 63.0 in | Wt 111.0 lb

## 2016-11-26 DIAGNOSIS — E7800 Pure hypercholesterolemia, unspecified: Secondary | ICD-10-CM

## 2016-11-26 DIAGNOSIS — Z01419 Encounter for gynecological examination (general) (routine) without abnormal findings: Secondary | ICD-10-CM | POA: Diagnosis not present

## 2016-11-26 MED ORDER — ZOLPIDEM TARTRATE 10 MG PO TABS: 10.0000 mg | ORAL_TABLET | Freq: Every day | ORAL | 5 refills | Status: DC

## 2016-11-26 MED ORDER — NORETHINDRONE ACET-ETHINYL EST 1-20 MG-MCG PO TABS: 1.0000 | ORAL_TABLET | Freq: Every day | ORAL | 11 refills | Status: DC

## 2016-11-26 NOTE — Patient Instructions (Signed)
Schedule your colonoscopy with either:  [facility]   Address: [address], [place], [place] [address]  [phone]    or  [facility]  Address: [address], [place], [place] [address]  [phone]       Call to Schedule your mammogram  Facilities in [place]: 1)  [facility]. [facility], [address] Phone: [phone] 2)  Dr. Bertrand at [facility]  [address] Phone: [phone]     Mammogram A mammogram is an X-ray test to find changes in a woman's breast. You should get a mammogram if:  You are 40 years of age or older  You have risk factors.   Your doctor recommends that you have one.  BEFORE THE TEST  Do not schedule the test the week before your period, especially if your breasts are sore during this time.  On the day of your mammogram:  Wash your breasts and armpits well. After washing, do not put on any deodorant or talcum powder on until after your test.   Eat and drink as you usually do.   Take your medicines as usual.   If you are diabetic and take insulin, make sure you:   Eat before coming for your test.   Take your insulin as usual.   If you cannot keep your appointment, call before the appointment to cancel. Schedule another appointment.  TEST  You will need to undress from the waist up. You will put on a hospital gown.   Your breast will be put on the mammogram machine, and it will press firmly on your breast with a piece of plastic called a compression paddle. This will make your breast flatter so that the machine can X-ray all parts of your breast.   Both breasts will be X-rayed. Each breast will be X-rayed from above and from the side. An X-ray might need to be taken again if the picture is not good enough.   The mammogram will last about 15 to 30 minutes.  AFTER THE TEST Finding out the results of your test Ask when  your test results will be ready. Make sure you get your test results.  Document Released: 01/11/2009 Document Revised: 10/04/2011 Document Reviewed: 01/11/2009 Exit[employer] Patient Information 2012 [employer].   

## 2016-11-26 NOTE — Progress Notes (Signed)
    Sara Camacho 01/21/1966 [id number]        51 y.o.  G1P0010 for annual exam.    Past medical history,surgical history, problem list, medications, allergies, family history and social history were all reviewed and documented as reviewed in the EPIC chart.  ROS:  Performed with pertinent positives and negatives included in the history, assessment and plan.   Additional significant findings :  None   Exam: Kennon PortelaKim Gardner assistant Vitals:   11/26/16 1203  BP: 120/76  Weight: 111 lb (50.3 kg)  Height: 5\' 3"  (1.6 m)   Body mass index is 19.66 kg/m.  General appearance:  Normal affect, orientation and appearance. Skin: Grossly normal HEENT: Without gross lesions.  No cervical or supraclavicular adenopathy. Thyroid normal.  Lungs:  Clear without wheezing, rales or rhonchi Cardiac: RR, without RMG Abdominal:  Soft, nontender, without masses, guarding, rebound, organomegaly or hernia Breasts:  Examined lying and sitting without masses, retractions, discharge or axillary adenopathy.  Bilateral implants noted.  Pelvic:  Ext, BUS, Vagina with mild atrophic changes  Cervix normal  Uterus anteverted, normal size, shape and contour, midline and mobile nontender   Adnexa without masses or tenderness    Anus and perineum normal   Rectovaginal normal sphincter tone without palpated masses or tenderness.    Assessment/Plan:  51 y.o. 541P0010 female for annual exam.   1. Oral contraceptives. Continues on her oral contraceptives. Never smoked and not being followed for any medical issues. Options to stop now and monitor versus continuing for more year discussed. At this point the patient prefers to continue. Risks to include stroke heart attack DVT discussed. Patient understands and accepts. Will plan stopping them next year and keeping a menstrual/symptom calendar. Refill 1 year provided. 2. Insomnia. Continues on Ambien 10 mg as needed for sleep. Does well with this. #30 with 5 refills  provided. 3. Valtrex intermittently for HSV outbreaks. Has supply at home but will call if she needs more. 4. Pap smear/HPV 2016 negative. No Pap smear done today. 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 5. Mammography overdo. Patient reminded she agrees to call to schedule. SBE monthly reviewed. 6. Colonoscopy never. I again discussed screening colonoscopy recommendations at age 51. Names and numbers provided for her to call and schedule. 7. Health maintenance. Recently had CBC, CMP thyroid done. Does have a history of borderline elevated cholesterol and LDL. Will check lipid profile now. Follow up in one year, sooner as needed.   Dara LordsFONTAINE,TIMOTHY P MD, 12:35 PM 11/26/2016

## 2016-11-27 ENCOUNTER — Other Ambulatory Visit: Payer: Self-pay | Admitting: Gynecology

## 2016-11-27 DIAGNOSIS — E782 Mixed hyperlipidemia: Secondary | ICD-10-CM

## 2016-11-27 LAB — LIPID PANEL
Cholesterol: 215 mg/dL — ABNORMAL HIGH (ref <200)
HDL: 62 mg/dL (ref >50)
LDL CALC: 103 mg/dL — AB (ref <100)
Total CHOL/HDL Ratio: 3.5 Ratio (ref <5.0)
Triglycerides: 249 mg/dL — ABNORMAL HIGH (ref <150)
VLDL: 50 mg/dL — AB (ref <30)

## 2016-12-18 IMAGING — MG MM DIAGNOSTIC UNILATERAL L
4 series · 4 of 4 positions shown · non-contrast
Comparison: Previous exam(s).

CLINICAL DATA: Patient is an asymptomatic 49-year-old female who
was recalled from screening mammography to further evaluate the left
breast.

EXAM:
DIGITAL DIAGNOSTIC LEFT MAMMOGRAM WITH IMPLANTS
ULTRASOUND LEFT BREAST

[L MLID (1 of 2)]
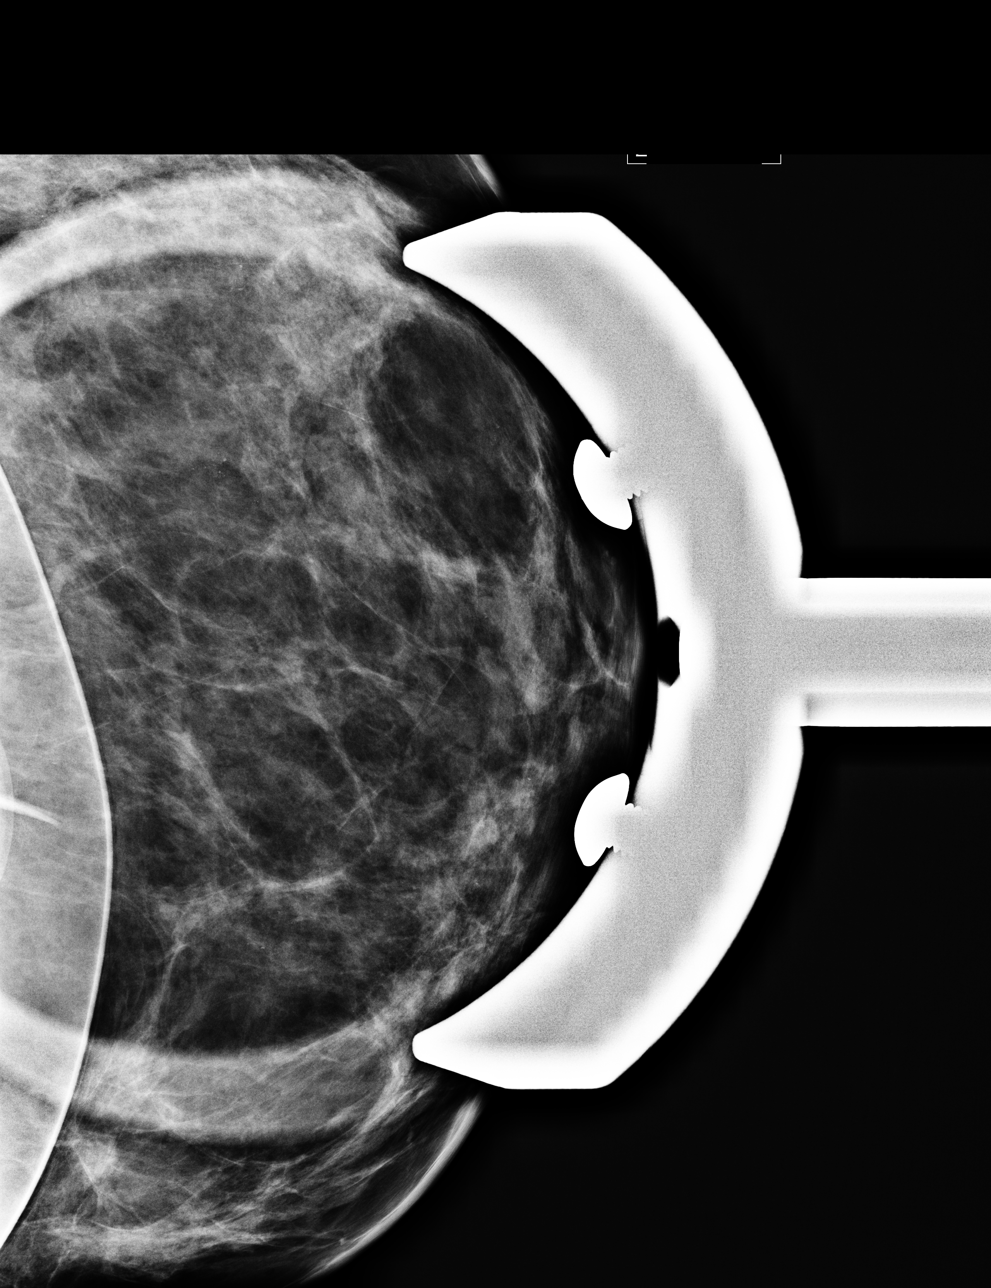

[L CCID (1 of 2)]
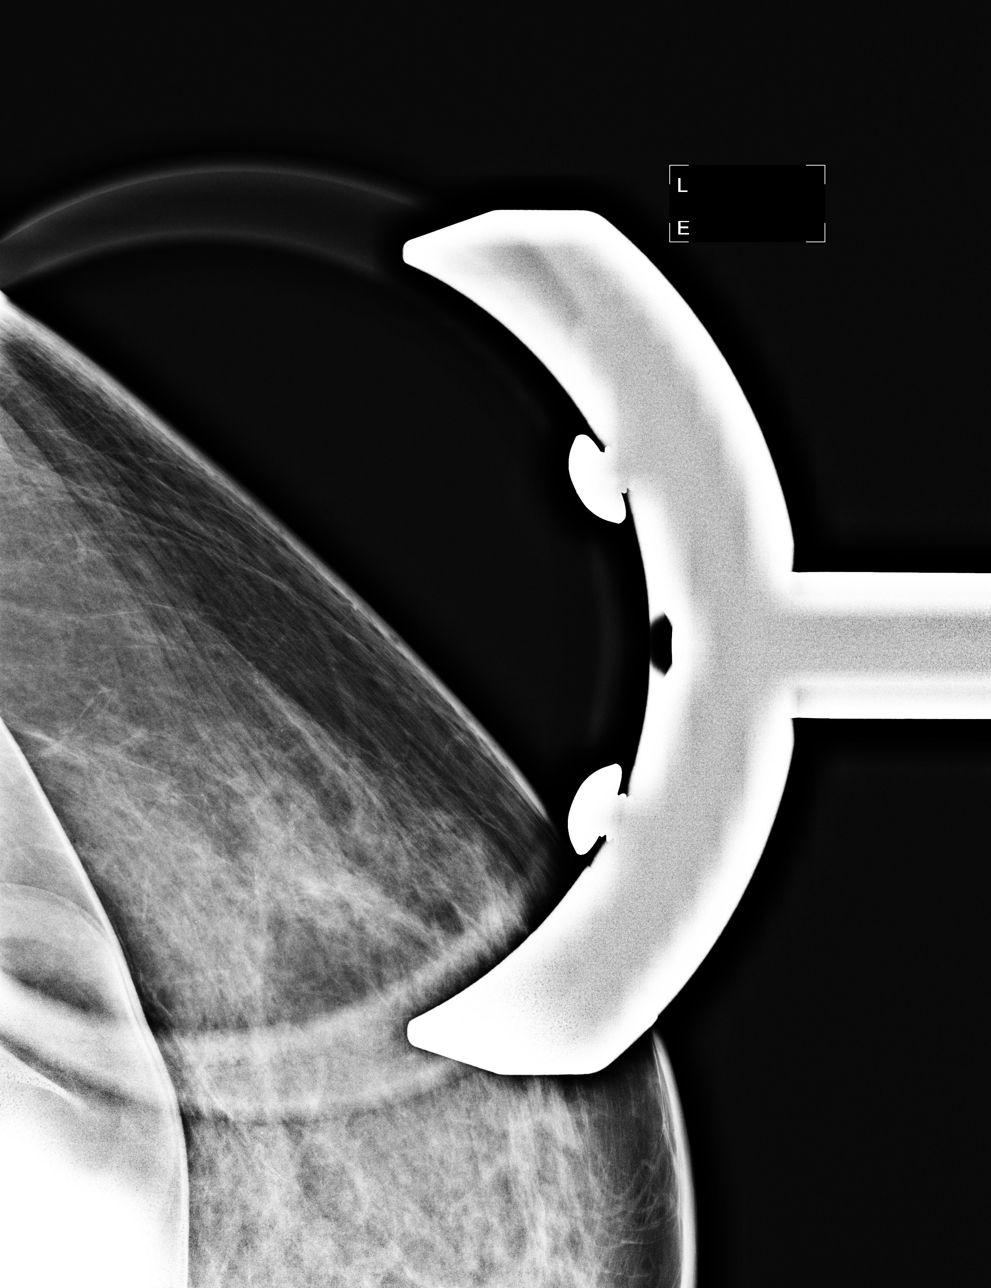

[L CCID (2 of 2)]
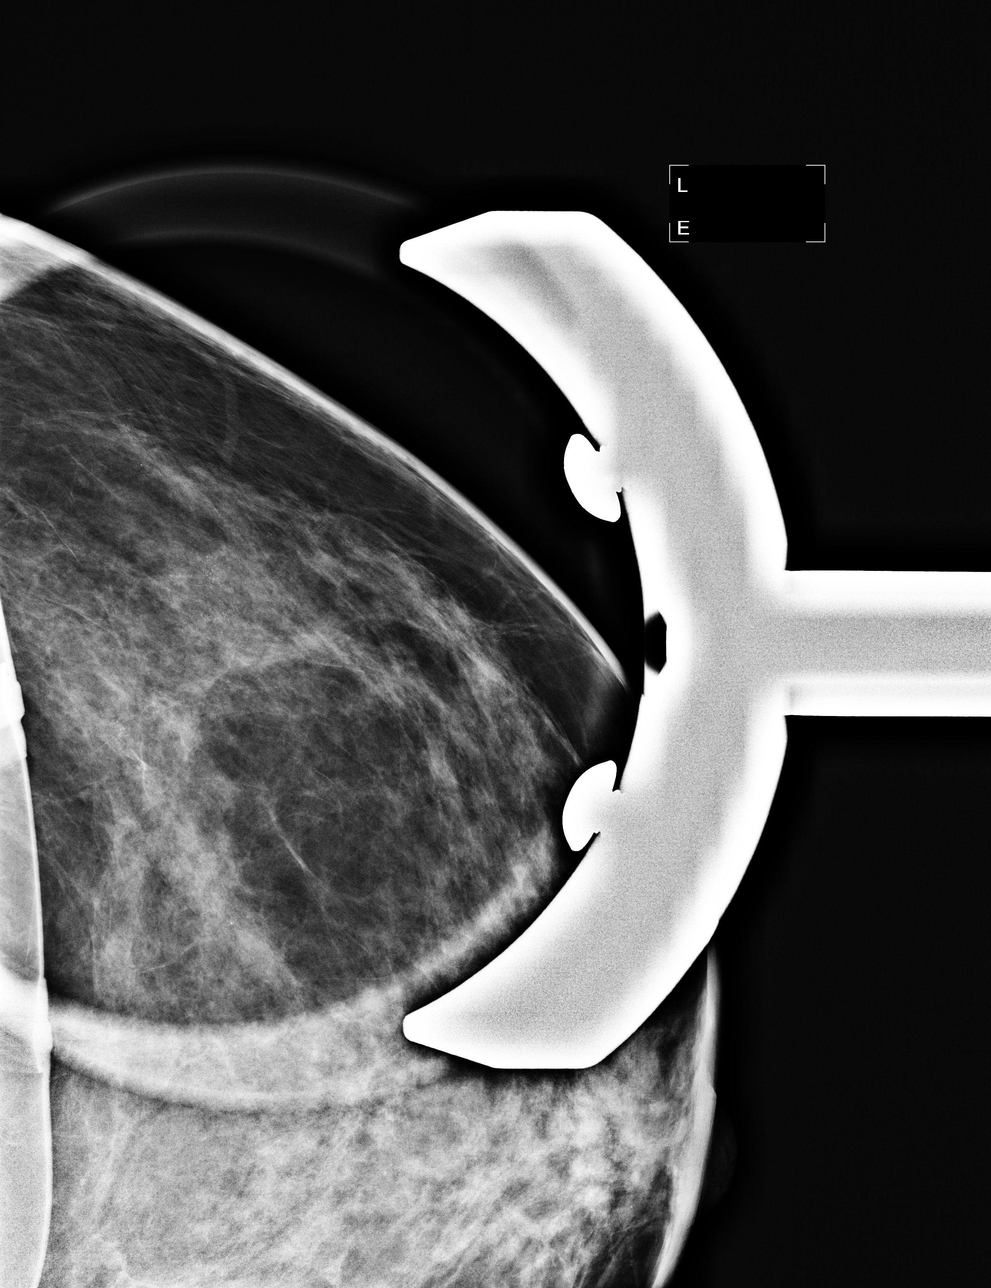

[L MLID (2 of 2)]
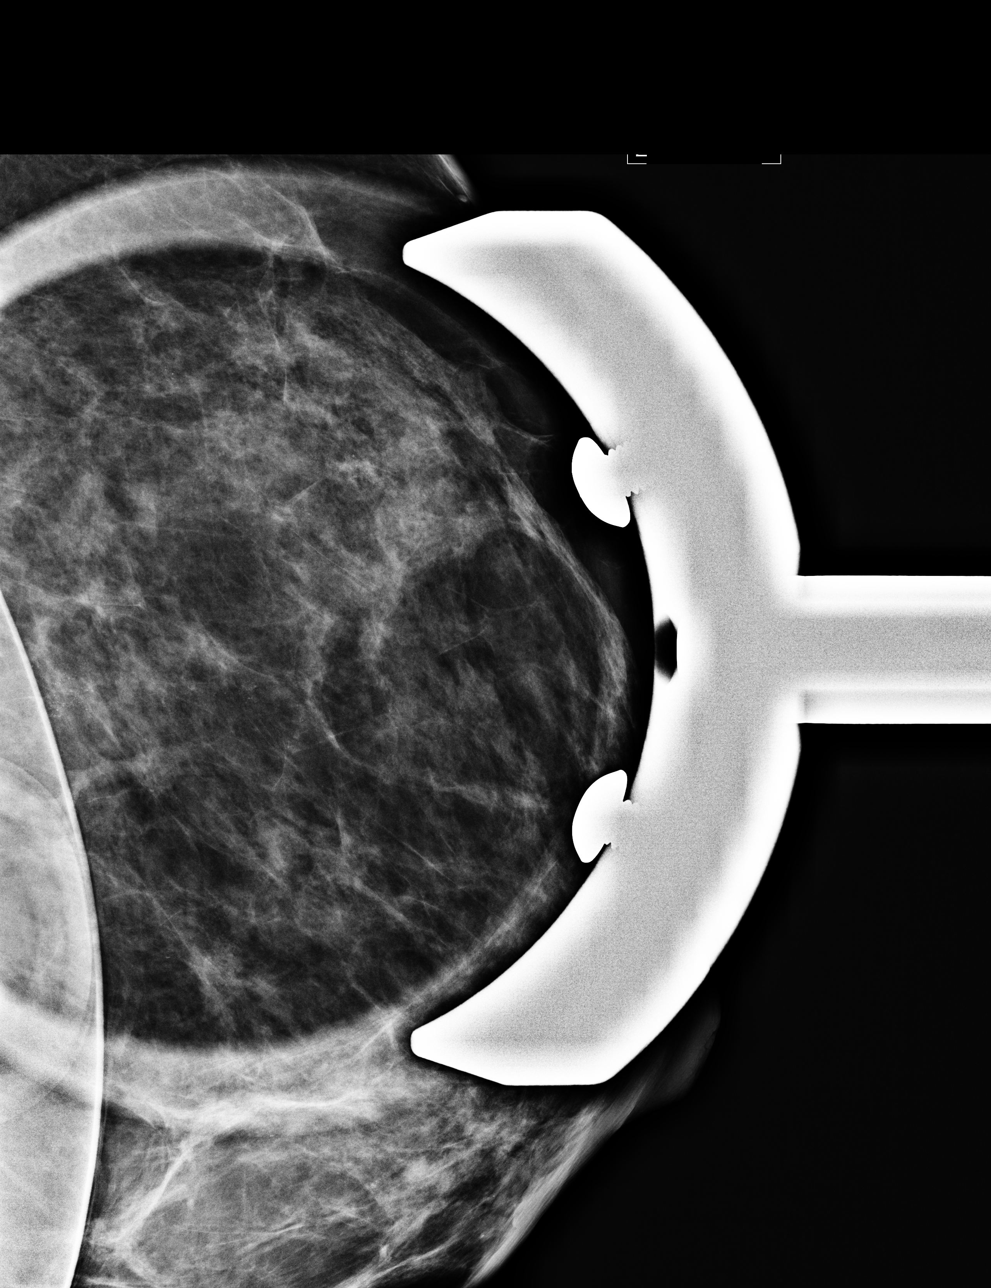

[4 of 4 positions shown; findings below may reference images not displayed]

ACR Breast Density Category c: The breast tissue is heterogeneously
dense, which may obscure small masses.
FINDINGS: Left unilateral digital diagnostic mammography with was performed
with implant displaced technique. Patient has subpectoral saline
breast implants which obscure a percentage of the breast parenchymal
volumes. These implant displaced views demonstrate a heterogeneously
dense parenchymal pattern which may obscure detection of small
masses. Spot-compression magnification views of the lateral breast
reveal effacement of normal fibroglandular tissue as well as
scattered punctate calcifications with benign mammographic features.
Grouped microcalcifications are identified in the outer breast with
round or punctate morphology. These are more defined in the gravity
dependent mediolateral spot-compression magnification view and may
represent intracystic calcium. Retrospective review of comparison
mammograms demonstrate these are grossly unchanged dating back to
5465.

On physical exam, no palpable masses are noted.

Targeted ultrasound is performed in the upper-outer and the outer
left breast. This demonstrates normal fibroglandular tissue. No
conspicuous cystic or solid masses identified and there are no
acoustic abnormalities. A left breast implant is noted.
IMPRESSION: Microcalcifications in the outer left breast demonstrate probably
benign mammographic features.

RECOMMENDATION:
Recommend short-term follow-up imaging with left diagnostic
mammography in 6 months.

I have discussed the findings and recommendations with the patient.
The importance of follow-up imaging was stressed. Results were also
provided in writing at the conclusion of the visit. If applicable, a
reminder letter will be sent to the patient regarding the next
appointment.

BI-RADS CATEGORY  3: Probably benign.

## 2017-01-07 ENCOUNTER — Other Ambulatory Visit: Payer: Self-pay | Admitting: Gynecology

## 2017-01-07 DIAGNOSIS — R921 Mammographic calcification found on diagnostic imaging of breast: Secondary | ICD-10-CM

## 2017-01-07 DIAGNOSIS — N63 Unspecified lump in unspecified breast: Secondary | ICD-10-CM

## 2017-01-14 ENCOUNTER — Ambulatory Visit
Admission: RE | Admit: 2017-01-14 | Discharge: 2017-01-14 | Disposition: A | Discharge status: Home / Self Care | Payer: BLUE CROSS/BLUE SHIELD | Source: Ambulatory Visit | Attending: Gynecology | Admitting: Gynecology

## 2017-01-14 ENCOUNTER — Other Ambulatory Visit: Payer: Self-pay | Admitting: Gynecology

## 2017-01-14 DIAGNOSIS — R921 Mammographic calcification found on diagnostic imaging of breast: Secondary | ICD-10-CM

## 2017-01-16 ENCOUNTER — Other Ambulatory Visit: Payer: Self-pay | Admitting: *Deleted

## 2017-01-16 MED ORDER — ESCITALOPRAM OXALATE 10 MG PO TABS: 10.0000 mg | ORAL_TABLET | Freq: Every day | ORAL | 1 refills | Status: DC

## 2017-01-17 ENCOUNTER — Ambulatory Visit
Admission: RE | Admit: 2017-01-17 | Discharge: 2017-01-17 | Disposition: A | Discharge status: Home / Self Care | Payer: BLUE CROSS/BLUE SHIELD | Source: Ambulatory Visit | Attending: Gynecology | Admitting: Gynecology

## 2017-01-17 DIAGNOSIS — R921 Mammographic calcification found on diagnostic imaging of breast: Secondary | ICD-10-CM

## 2017-02-14 ENCOUNTER — Encounter: Payer: Self-pay | Admitting: Family Medicine

## 2017-02-14 ENCOUNTER — Ambulatory Visit (INDEPENDENT_AMBULATORY_CARE_PROVIDER_SITE_OTHER): Payer: BLUE CROSS/BLUE SHIELD | Admitting: Family Medicine

## 2017-02-14 VITALS — BP 98/68 | HR 79 | Temp 98.4°F | Ht 63.0 in | Wt 115.0 lb

## 2017-02-14 DIAGNOSIS — J209 Acute bronchitis, unspecified: Secondary | ICD-10-CM | POA: Diagnosis not present

## 2017-02-14 MED ORDER — HYDROCODONE-HOMATROPINE 5-1.5 MG/5ML PO SYRP: 5.0000 mL | ORAL_SOLUTION | ORAL | 0 refills | Status: DC | PRN

## 2017-02-14 MED ORDER — AZITHROMYCIN 250 MG PO TABS: ORAL_TABLET | ORAL | 0 refills | Status: DC

## 2017-02-14 MED ORDER — VALACYCLOVIR HCL 1 G PO TABS: 500.0000 mg | ORAL_TABLET | Freq: Two times a day (BID) | ORAL | 1 refills | Status: DC

## 2017-02-14 NOTE — Progress Notes (Signed)
Pre visit review using our clinic review tool, if applicable. No additional management support is needed unless otherwise documented below in the visit note. 

## 2017-02-14 NOTE — Progress Notes (Signed)
   Subjective:    Patient ID: Sara Camacho, female    DOB: 06/07/1966, 51 y.o.   MRN: [id number]  HPI Here for 3 weeks of stuffy head, PND, ST, chest congestion and coughing up yellow sputum. No fever. Using Chloraceptic lozenges and drinking fluids.    Review of Systems  Constitutional: Negative.   HENT: Positive for congestion, postnasal drip, sinus pressure and sore throat. Negative for sinus pain and trouble swallowing.   Eyes: Negative.   Respiratory: Positive for cough and chest tightness. Negative for shortness of breath and wheezing.        Objective:   Physical Exam  Constitutional: She appears well-developed and well-nourished. No distress.  HENT:  Right Ear: External ear normal.  Left Ear: External ear normal.  Nose: Nose normal.  Mouth/Throat: Oropharynx is clear and moist.  Eyes: Conjunctivae are normal.  Neck: No thyromegaly present.  Pulmonary/Chest: Effort normal. No respiratory distress. She has no wheezes. She has no rales.  Scattered rhonchi   Lymphadenopathy:    She has no cervical adenopathy.          Assessment & Plan:  Bronchitis, treat with a Zpack.  Gershon Crane, MD

## 2017-02-14 NOTE — Patient Instructions (Signed)
WE NOW OFFER   [facility] FAST TRACK!!!  SAME DAY Appointments for ACUTE CARE  Such as: Sprains, Injuries, cuts, abrasions, rashes, muscle pain, joint pain, back pain Colds, flu, sore throats, headache, allergies, cough, fever  Ear pain, sinus and eye infections Abdominal pain, nausea, vomiting, diarrhea, upset stomach Animal/insect bites  3 Easy Ways to Schedule: Walk-In Scheduling Call in scheduling Mychart Sign-up: [email or web address]         

## 2017-02-27 ENCOUNTER — Encounter: Payer: Self-pay | Admitting: Family Medicine

## 2017-02-27 ENCOUNTER — Ambulatory Visit (INDEPENDENT_AMBULATORY_CARE_PROVIDER_SITE_OTHER): Payer: BLUE CROSS/BLUE SHIELD | Admitting: Family Medicine

## 2017-02-27 VITALS — BP 110/70 | HR 80 | Temp 97.6°F | Wt 116.1 lb

## 2017-02-27 DIAGNOSIS — R059 Cough, unspecified: Secondary | ICD-10-CM

## 2017-02-27 DIAGNOSIS — R05 Cough: Secondary | ICD-10-CM | POA: Diagnosis not present

## 2017-02-27 MED ORDER — BENZONATATE 200 MG PO CAPS: 200.0000 mg | ORAL_CAPSULE | Freq: Three times a day (TID) | ORAL | 0 refills | Status: DC | PRN

## 2017-02-27 NOTE — Progress Notes (Signed)
Subjective:     Patient ID: Sara Camacho, female   DOB: Nov 20, 1965, 51 y.o.   MRN: [id number]  HPI Patient is nonsmoker who is seen with approximately one-month history of cough. She was seen here on April 19 and prescribed Zithromax and Hydromet. She felt somewhat better after taking Zithromax but has had recurrence of cough which is mostly dry. Worse at night. She's using Hydromet which helped somewhat at night but she has grogginess afterwards. She's not had any fever. No chills. No dyspnea. No hemoptysis. No GERD symptoms. Possible mild postnasal drip symptoms. No wheezing. No appetite or weight changes. No chronic sinusitis symptoms  Past Medical History:  Diagnosis Date  . Anxiety   . ANXIETY, SITUATIONAL 11/27/2010  . BACK PAIN, UPPER 02/15/2009  . CERVICAL LYMPHADENOPATHY 02/15/2009  . INSOMNIA, CHRONIC 02/15/2009   Past Surgical History:  Procedure Laterality Date  . AUGMENTATION MAMMAPLASTY Bilateral 10/29/2001   augmentation-removal-reinsertion  . Eyebrow lift    . GYNECOLOGIC CRYOSURGERY  1987    reports that she has never smoked. She has never used smokeless tobacco. She reports that she drinks alcohol. She reports that she does not use drugs. family history includes Breast cancer in her maternal grandmother; Cancer in her father and mother. Allergies  Allergen Reactions  . Penicillins Other (See Comments)    Childhood history     Review of Systems  Constitutional: Negative for chills and fever.  HENT: Negative for congestion and sinus pressure.   Respiratory: Positive for cough. Negative for shortness of breath and wheezing.   Cardiovascular: Negative for chest pain, palpitations and leg swelling.       Objective:   Physical Exam  Constitutional: She appears well-developed and well-nourished.  HENT:  Right Ear: External ear normal.  Left Ear: External ear normal.  Mouth/Throat: Oropharynx is clear and moist.  Neck: Neck supple.  Cardiovascular: Normal rate and  regular rhythm.   Pulmonary/Chest: Effort normal and breath sounds normal. No respiratory distress. She has no wheezes. She has no rales.  Musculoskeletal: She exhibits no edema.  Lymphadenopathy:    She has no cervical adenopathy.       Assessment:     Persistent cough. Nonfocal exam. Suspect recent viral bronchitis with residual cough. She's not any GERD symptoms. No wheezing. No obvious postnasal drip symptoms    Plan:     -Consider over-the-counter Coricidin -Tessalon Perles 200 mg every 8 hours as needed for cough -Continue with Hydromet as needed if not relieved with Tessalon -Chest x-ray ordered if cough not resolving by early next week  Kristian Covey MD [facility]

## 2017-02-27 NOTE — Patient Instructions (Signed)
Follow up for any fever or increased shortness of breath. Go for CXR if cough persists.

## 2017-02-27 NOTE — Progress Notes (Signed)
Pre visit review using our clinic review tool, if applicable. No additional management support is needed unless otherwise documented below in the visit note. 

## 2017-03-01 ENCOUNTER — Ambulatory Visit (INDEPENDENT_AMBULATORY_CARE_PROVIDER_SITE_OTHER)
Admission: RE | Admit: 2017-03-01 | Discharge: 2017-03-01 | Disposition: A | Discharge status: Home / Self Care | Payer: BLUE CROSS/BLUE SHIELD | Source: Ambulatory Visit | Attending: Family Medicine | Admitting: Family Medicine

## 2017-03-01 DIAGNOSIS — R05 Cough: Secondary | ICD-10-CM | POA: Diagnosis not present

## 2017-03-01 DIAGNOSIS — R059 Cough, unspecified: Secondary | ICD-10-CM

## 2017-03-13 ENCOUNTER — Encounter: Payer: Self-pay | Admitting: Gynecology

## 2017-04-11 ENCOUNTER — Other Ambulatory Visit: Payer: Self-pay | Admitting: Family Medicine

## 2017-06-11 ENCOUNTER — Other Ambulatory Visit: Payer: Self-pay | Admitting: Gynecology

## 2017-06-12 NOTE — Telephone Encounter (Signed)
Called into pharmacy

## 2017-07-08 ENCOUNTER — Telehealth: Payer: Self-pay | Admitting: Family Medicine

## 2017-07-08 MED ORDER — CEPHALEXIN 500 MG PO CAPS: 500.0000 mg | ORAL_CAPSULE | Freq: Three times a day (TID) | ORAL | 0 refills | Status: DC

## 2017-07-08 NOTE — Telephone Encounter (Signed)
Rx sent 

## 2017-07-08 NOTE — Telephone Encounter (Signed)
Pt would like to have cephalexin for facial break out  Pharm:  [facility] in [place]  Pt would like to have a call back.

## 2017-07-08 NOTE — Telephone Encounter (Signed)
Refill OK- Keflex 500 mg po tid for 10 days #30  (she has taken for peri-oral dermatitis).

## 2017-07-18 ENCOUNTER — Encounter: Payer: Self-pay | Admitting: Family Medicine

## 2017-08-09 ENCOUNTER — Other Ambulatory Visit: Payer: Self-pay | Admitting: Family Medicine

## 2017-10-18 ENCOUNTER — Encounter: Payer: Self-pay | Admitting: Family Medicine

## 2017-10-18 ENCOUNTER — Ambulatory Visit: Payer: BLUE CROSS/BLUE SHIELD | Admitting: Family Medicine

## 2017-10-18 VITALS — BP 110/70 | HR 84 | Temp 98.3°F | Ht 63.0 in | Wt 122.8 lb

## 2017-10-18 DIAGNOSIS — F411 Generalized anxiety disorder: Secondary | ICD-10-CM | POA: Diagnosis not present

## 2017-10-18 DIAGNOSIS — G4700 Insomnia, unspecified: Secondary | ICD-10-CM | POA: Diagnosis not present

## 2017-10-18 DIAGNOSIS — L710 Perioral dermatitis: Secondary | ICD-10-CM

## 2017-10-18 DIAGNOSIS — L709 Acne, unspecified: Secondary | ICD-10-CM

## 2017-10-18 MED ORDER — SPIRONOLACTONE 100 MG PO TABS: 100.0000 mg | ORAL_TABLET | Freq: Every day | ORAL | 1 refills | Status: DC

## 2017-10-18 MED ORDER — ESCITALOPRAM OXALATE 10 MG PO TABS: 10.0000 mg | ORAL_TABLET | Freq: Every day | ORAL | 3 refills | Status: DC

## 2017-10-18 MED ORDER — CEPHALEXIN 500 MG PO CAPS: 500.0000 mg | ORAL_CAPSULE | Freq: Three times a day (TID) | ORAL | 1 refills | Status: DC | PRN

## 2017-10-18 MED ORDER — TRETINOIN 0.025 % EX CREA: TOPICAL_CREAM | Freq: Every day | CUTANEOUS | 2 refills | Status: DC

## 2017-10-18 MED ORDER — ZOLPIDEM TARTRATE 10 MG PO TABS: 10.0000 mg | ORAL_TABLET | Freq: Every day | ORAL | 5 refills | Status: DC

## 2017-10-18 NOTE — Progress Notes (Signed)
Subjective:     Patient ID: Sara Camacho, female   DOB: 12/26/1965, 51 y.o.   MRN: [id number]  HPI Patient seen for medical follow-up. She is requesting several refills today. She states her insurance will run out after the end of the year. She unfortunately recently lost her job and also had to change place of residence.  Requesting several refills including Lexapro, Keflex, Retin-A, Ambien, and spirinolactone.  She has history of perioral dermatitis and takes Keflex as needed. She was placed on Aldactone by dermatologist and states that was helping her acne symptoms significantly. She also does use Retin-A.  She is on low-dose Lexapro which she has taken for anxiety in the past and that is relatively stable. She has chronic insomnia and is on Ambien for that.  No regular alcohol use.    Past Medical History:  Diagnosis Date  . Anxiety   . ANXIETY, SITUATIONAL 11/27/2010  . BACK PAIN, UPPER 02/15/2009  . CERVICAL LYMPHADENOPATHY 02/15/2009  . INSOMNIA, CHRONIC 02/15/2009   Past Surgical History:  Procedure Laterality Date  . AUGMENTATION MAMMAPLASTY Bilateral 10/29/2001   augmentation-removal-reinsertion  . Eyebrow lift    . GYNECOLOGIC CRYOSURGERY  1987    reports that  has never smoked. she has never used smokeless tobacco. She reports that she drinks alcohol. She reports that she does not use drugs. family history includes Breast cancer in her maternal grandmother; Cancer in her father and mother. Allergies  Allergen Reactions  . Penicillins Other (See Comments)    Childhood history      Review of Systems  Constitutional: Negative for fatigue.  Eyes: Negative for visual disturbance.  Respiratory: Negative for cough, chest tightness, shortness of breath and wheezing.   Cardiovascular: Negative for chest pain, palpitations and leg swelling.  Skin: Positive for rash.  Neurological: Negative for dizziness, seizures, syncope, weakness, light-headedness and headaches.   Psychiatric/Behavioral: Positive for sleep disturbance. Negative for dysphoric mood.       Objective:   Physical Exam  Constitutional: She is oriented to person, place, and time. She appears well-developed and well-nourished.  Cardiovascular: Normal rate and regular rhythm.  Pulmonary/Chest: Effort normal and breath sounds normal. No respiratory distress. She has no wheezes. She has no rales.  Neurological: She is alert and oriented to person, place, and time.  Skin:  Pt has makeup on which makes exam more challenging.  Does have some small erythematous papules on face.  No pustules noted.       Assessment:     #1 history of chronic anxiety stable on Lexapro  #2 history of perioral dermatitis  #3 history of chronic insomnia  #4 adult acne.        Plan:     -Refilled several medications including Lexapro, Retin-A, Ambien -Sleep hygiene discussed -Patient was started by dermatologist on Aldactone. She is requesting refill through us. We explained that if she is to be maintained on that drug would need periodic blood work including electrolytes.  Kristian CoveyBruce W Adison Jerger MD [facility]

## 2017-11-15 ENCOUNTER — Other Ambulatory Visit: Payer: Self-pay | Admitting: *Deleted

## 2017-11-15 MED ORDER — ESCITALOPRAM OXALATE 10 MG PO TABS: 10.0000 mg | ORAL_TABLET | Freq: Every day | ORAL | 3 refills | Status: DC

## 2017-12-03 ENCOUNTER — Other Ambulatory Visit: Payer: Self-pay | Admitting: Gynecology

## 2017-12-30 ENCOUNTER — Other Ambulatory Visit: Payer: Self-pay | Admitting: Gynecology

## 2018-01-03 ENCOUNTER — Other Ambulatory Visit: Payer: Self-pay | Admitting: Gynecology

## 2018-01-03 NOTE — Telephone Encounter (Signed)
Annual on 03/05/18

## 2018-02-25 ENCOUNTER — Other Ambulatory Visit: Payer: Self-pay | Admitting: Family Medicine

## 2018-02-25 NOTE — Telephone Encounter (Signed)
Refill once OK- takes for perioral dermatitis flares

## 2018-02-26 ENCOUNTER — Other Ambulatory Visit: Payer: Self-pay | Admitting: Family Medicine

## 2018-03-05 ENCOUNTER — Encounter: Payer: Self-pay | Admitting: Gynecology

## 2018-03-05 ENCOUNTER — Ambulatory Visit (INDEPENDENT_AMBULATORY_CARE_PROVIDER_SITE_OTHER): Payer: Self-pay | Admitting: Gynecology

## 2018-03-05 VITALS — BP 116/74 | Ht 62.5 in | Wt 122.0 lb

## 2018-03-05 DIAGNOSIS — G4700 Insomnia, unspecified: Secondary | ICD-10-CM

## 2018-03-05 DIAGNOSIS — Z01419 Encounter for gynecological examination (general) (routine) without abnormal findings: Secondary | ICD-10-CM

## 2018-03-05 DIAGNOSIS — N952 Postmenopausal atrophic vaginitis: Secondary | ICD-10-CM

## 2018-03-05 DIAGNOSIS — L709 Acne, unspecified: Secondary | ICD-10-CM

## 2018-03-05 MED ORDER — ZOLPIDEM TARTRATE 10 MG PO TABS: 10.0000 mg | ORAL_TABLET | Freq: Every day | ORAL | 5 refills | Status: DC

## 2018-03-05 MED ORDER — CEPHALEXIN 500 MG PO CAPS: ORAL_CAPSULE | ORAL | 1 refills | Status: DC

## 2018-03-05 MED ORDER — VALACYCLOVIR HCL 1 G PO TABS: 500.0000 mg | ORAL_TABLET | Freq: Two times a day (BID) | ORAL | 1 refills | Status: AC

## 2018-03-05 NOTE — Patient Instructions (Addendum)
Stop the birth control pills as we discussed.  Call me if you have any issues with this as far as irregular bleeding or menopausal type symptoms such as hot flushes or sweats.  Schedule your colonoscopy with either:  [facility]   Address: [address] [place], [place], [place] [address]  [phone]    or  [facility]  Address: [address] [place], [place], [place] [address]  [phone]

## 2018-03-05 NOTE — Progress Notes (Signed)
Sara Camacho Nov 09, 1965 [id number]        52 y.o.  G1P0010 for annual gynecologic exam.  Without gynecologic complaints.  Past medical history,surgical history, problem list, medications, allergies, family history and social history were all reviewed and documented as reviewed in the EPIC chart.  ROS:  Performed with pertinent positives and negatives included in the history, assessment and plan.   Additional significant findings : None   Exam: Sara Camacho assistant Vitals:   03/05/18 1036  BP: 116/74  Weight: 122 lb (55.3 kg)  Height: 5' 2.5" (1.588 m)   Body mass index is 21.96 kg/m.  General appearance:  Normal affect, orientation and appearance. Skin: Grossly normal HEENT: Without gross lesions.  No cervical or supraclavicular adenopathy. Thyroid normal.  Lungs:  Clear without wheezing, rales or rhonchi Cardiac: RR, without RMG Abdominal:  Soft, nontender, without masses, guarding, rebound, organomegaly or hernia Breasts:  Examined lying and sitting without masses, retractions, discharge or axillary adenopathy.  Bilateral implants noted. Pelvic:  Ext, BUS, Vagina: Normal  Cervix: Normal  Uterus: Anteverted, normal size, shape and contour, midline and mobile nontender   Adnexa: Without masses or tenderness    Anus and perineum: Normal   Rectovaginal: Normal sphincter tone without palpated masses or tenderness.    Assessment/Plan:  52 y.o. G31P0010 female for annual gynecologic exam.   1. Oral contraceptives.  Continues on oral contraceptives.  Reviewed options with her and recommended patient stop her pills now given her age of 37.  Back-up contraception and monitor for symptoms to include menopausal such as hot flushes and sweats as well as bleeding.  If she would develop significant symptoms but no bleeding then recommend a discussion of HRT.  If she would have irregular bleeding with or without symptoms then issues of going back on the birth control pills for  suppression 1 more year and then stopping next year.  The issues of birth control pills particularly at age 49 discussed to include thrombosis such as stroke heart attack DVT.  She is never smoked and not being followed for any significant medical issues.  She will go ahead and stop the pills now and call me if she has any issues. 2. Mammography due now and I reminded the patient to schedule.  Breast exam normal today. 3. Pap smear/HPV 2016.  No Pap smear done today.  History of cryosurgery 1987 first LGSIL with normal Pap smears since.  Plan follow-up Pap smear/HPV at 5-year interval per current screening guidelines. 4. Colonoscopy never.  I again recommended patient schedule a screening colonoscopy.  She is currently without insurance and looking for work.  When she establishes and gets health insurance she is going to schedule this. 5. Insomnia.  Uses Ambien 10 mg as needed for sleep and does well with this without side effects.  Previously filled by Dr. Caryl Never and I refilled for her at #30 with 5 refills provided. 6. Valtrex intermittently for HSV outbreaks.  Previously refilled by Dr. Caryl Never but I refilled her at #30 with 3 refills provided. 7. Uses Keflex 3 times daily with acne outbreaks provided by Dr. Caryl Never and asked if I could refill.  Due to lack of insurance money is tight and I agreed to do so.  #60 with 1 refill provided. 8. Health maintenance.  No routine lab work done at this time.  Patient is going to have done after she gets new health insurance.  Follow-up in 1 year, sooner if any issues after stopping  her birth control pills.   Dara Lords MD, 11:09 AM 03/05/2018

## 2018-03-05 NOTE — Addendum Note (Signed)
Addended by: Dara Lords on: 03/05/2018 11:34 AM   Modules accepted: Orders

## 2018-03-06 ENCOUNTER — Telehealth: Payer: Self-pay | Admitting: *Deleted

## 2018-03-06 MED ORDER — NORETHINDRONE ACET-ETHINYL EST 1-20 MG-MCG PO TABS: 1.0000 | ORAL_TABLET | Freq: Every day | ORAL | 0 refills | Status: DC

## 2018-03-06 NOTE — Telephone Encounter (Signed)
Okay to refill x3

## 2018-03-06 NOTE — Telephone Encounter (Signed)
Pt informed, Rx sent. 

## 2018-03-06 NOTE — Telephone Encounter (Signed)
Patient was seen yesterday for annual exam was told to stop birth control pills, she asked if she could have 1 month worth and then stop? She will be leaving for the beach and doesn't want to stop pills  and worry about feeling bad or take a chance with any bleeding. Please advise

## 2018-05-05 ENCOUNTER — Other Ambulatory Visit: Payer: Self-pay | Admitting: Gynecology

## 2018-05-13 ENCOUNTER — Other Ambulatory Visit: Payer: Self-pay | Admitting: Gynecology

## 2018-06-20 ENCOUNTER — Other Ambulatory Visit: Payer: Self-pay | Admitting: Gynecology

## 2018-06-20 NOTE — Telephone Encounter (Signed)
At her annual exam 03/05/18  you wrote "Uses Keflex 3 times daily with acne outbreaks provided by Dr. Caryl NeverBurchette and asked if I could refill.  Due to lack of insurance money is tight and I agreed to do so.  #60 with 1 refill provided."

## 2018-09-11 ENCOUNTER — Other Ambulatory Visit: Payer: Self-pay | Admitting: Gynecology

## 2018-11-11 ENCOUNTER — Other Ambulatory Visit: Payer: Self-pay | Admitting: Gynecology

## 2018-12-08 ENCOUNTER — Ambulatory Visit (INDEPENDENT_AMBULATORY_CARE_PROVIDER_SITE_OTHER): Payer: Self-pay | Admitting: Family Medicine

## 2018-12-08 ENCOUNTER — Encounter: Payer: Self-pay | Admitting: Family Medicine

## 2018-12-08 VITALS — BP 122/80 | HR 102 | Temp 98.1°F | Wt 124.1 lb

## 2018-12-08 DIAGNOSIS — R52 Pain, unspecified: Secondary | ICD-10-CM

## 2018-12-08 DIAGNOSIS — J019 Acute sinusitis, unspecified: Secondary | ICD-10-CM

## 2018-12-08 DIAGNOSIS — S00511A Abrasion of lip, initial encounter: Secondary | ICD-10-CM

## 2018-12-08 DIAGNOSIS — J101 Influenza due to other identified influenza virus with other respiratory manifestations: Secondary | ICD-10-CM

## 2018-12-08 LAB — POCT INFLUENZA A/B
INFLUENZA A, POC: NEGATIVE
Influenza B, POC: POSITIVE — AB

## 2018-12-08 MED ORDER — HYDROCORTISONE VALERATE 0.2 % EX CREA: 1.0000 "application " | TOPICAL_CREAM | Freq: Two times a day (BID) | CUTANEOUS | 0 refills | Status: AC

## 2018-12-08 MED ORDER — DICLOFENAC SODIUM 75 MG PO TBEC: 75.0000 mg | DELAYED_RELEASE_TABLET | Freq: Two times a day (BID) | ORAL | 2 refills | Status: DC

## 2018-12-08 MED ORDER — AZITHROMYCIN 250 MG PO TABS: ORAL_TABLET | ORAL | 0 refills | Status: DC

## 2018-12-08 NOTE — Progress Notes (Signed)
   Subjective:    Patient ID: Sara Camacho, female    DOB: July 23, 1966, 53 y.o.   MRN: [id number]  HPI Here for 2 weeks of fever to 102 degrees, generalized weakness, body aches, and a dry cough. She fell twice at home and bruised her lip when she felt faint. No true LOC. The aches and fever went away but then she developed sinus pressure, PND, and blowing yellow mucus from the nose. She is drinking fluids, and taking Tylenol and Benzonatate.    Review of Systems  Constitutional: Positive for fatigue and fever.  HENT: Positive for congestion, postnasal drip, sinus pressure and sinus pain. Negative for sore throat.   Eyes: Negative.   Respiratory: Positive for cough.   Gastrointestinal: Negative.        Objective:   Physical Exam Constitutional:      Appearance: Normal appearance. She is not ill-appearing.  HENT:     Right Ear: Tympanic membrane and ear canal normal.     Left Ear: Tympanic membrane and ear canal normal.     Nose: Nose normal.     Mouth/Throat:     Pharynx: Oropharynx is clear.     Comments: The lower lip is mildly swollen and has an abrasion on it Eyes:     Conjunctiva/sclera: Conjunctivae normal.  Cardiovascular:     Rate and Rhythm: Normal rate and regular rhythm.     Pulses: Normal pulses.     Heart sounds: Normal heart sounds.  Pulmonary:     Effort: Pulmonary effort is normal. No respiratory distress.     Breath sounds: Normal breath sounds. No stridor. No wheezing, rhonchi or rales.  Lymphadenopathy:     Cervical: No cervical adenopathy.  Neurological:     General: No focal deficit present.     Mental Status: She is alert and oriented to person, place, and time.           Assessment & Plan:  It sounds like she started off with influenza and she became dehydrated. She likely had a few orthostatic episodes and bruised the lip. Now she has a sinusitis, and we will treat this with a Zpack. She can use Diclofenac as needed for joint pains. Drink lots  of fluids.  Gershon Crane, MD

## 2018-12-19 ENCOUNTER — Telehealth: Payer: Self-pay | Admitting: Family Medicine

## 2018-12-19 NOTE — Telephone Encounter (Signed)
Please advise 

## 2018-12-19 NOTE — Telephone Encounter (Unsigned)
Copied from CRM [id number]. Topic: Quick Communication - Rx Refill/Question >> Dec 19, 2018  4:02 PM Donita Brooks wrote: Medication: cephALEXin KEFLEX 500mg  capsule  Has the patient contacted their pharmacy? Yes.    (Agent: If yes, when and what did the pharmacy advise?) Pt just saw Dr. Clent Ridges for visit. She forgot to mention this prescription to him to refill.  Preferred Pharmacy (with phone number or street name): [id number] [place], [place] - [address] [phone] (Phone) [phone] (Fax)    Agent: Please be advised that RX refills may take up to 3 business days. We ask that you follow-up with your pharmacy.

## 2018-12-19 NOTE — Telephone Encounter (Signed)
Review of chart does show long time Rx for this prescription in medication history. Request forwarded to PCP

## 2018-12-19 NOTE — Telephone Encounter (Signed)
Left message for patient to call back regarding need for keflex.

## 2018-12-20 NOTE — Telephone Encounter (Signed)
Refill once.  She has taken this in past for peri-oral dermatitis flares.

## 2018-12-22 ENCOUNTER — Other Ambulatory Visit: Payer: Self-pay

## 2018-12-22 MED ORDER — CEPHALEXIN 500 MG PO CAPS: 500.0000 mg | ORAL_CAPSULE | Freq: Three times a day (TID) | ORAL | 0 refills | Status: DC

## 2018-12-22 NOTE — Telephone Encounter (Signed)
Please advise quantity. I see history of TID. Patient recently received 60 capsules on 12/08/18 from Dr. Audie Box.

## 2018-12-22 NOTE — Telephone Encounter (Signed)
Called patient and let her know that I have sent in her refill for Keflex to the pharmacy for her. Patient verbalized an understanding and thanked [place].

## 2018-12-22 NOTE — Telephone Encounter (Signed)
This med was actually filled in January, she had a z-pack on 12/08/18  OK to send 30 or 60?

## 2018-12-22 NOTE — Telephone Encounter (Signed)
Refill #60 

## 2018-12-22 NOTE — Telephone Encounter (Signed)
If refilled on 12-08-18 should not need refill yet.

## 2019-03-09 ENCOUNTER — Other Ambulatory Visit: Payer: Self-pay

## 2019-03-09 ENCOUNTER — Ambulatory Visit (INDEPENDENT_AMBULATORY_CARE_PROVIDER_SITE_OTHER): Payer: Self-pay | Admitting: Family Medicine

## 2019-03-09 ENCOUNTER — Other Ambulatory Visit: Payer: Self-pay | Admitting: Family Medicine

## 2019-03-09 DIAGNOSIS — G4700 Insomnia, unspecified: Secondary | ICD-10-CM

## 2019-03-09 DIAGNOSIS — L710 Perioral dermatitis: Secondary | ICD-10-CM

## 2019-03-09 MED ORDER — ZOLPIDEM TARTRATE 10 MG PO TABS: 10.0000 mg | ORAL_TABLET | Freq: Every day | ORAL | 5 refills | Status: DC

## 2019-03-09 MED ORDER — TRETINOIN 0.025 % EX CREA: TOPICAL_CREAM | Freq: Every day | CUTANEOUS | 2 refills | Status: AC

## 2019-03-09 MED ORDER — CEPHALEXIN 500 MG PO CAPS: 500.0000 mg | ORAL_CAPSULE | Freq: Three times a day (TID) | ORAL | 0 refills | Status: DC

## 2019-03-09 NOTE — Telephone Encounter (Signed)
Patient has an appointment today at 11:45 am for Doxy for these medications. Ambien last filled by her OBGYN.

## 2019-03-09 NOTE — Progress Notes (Signed)
Patient ID: Sara Camacho, female   DOB: 09/25/1966, 53 y.o.   MRN: [id number]  This visit type was conducted due to national recommendations for restrictions regarding the COVID-19 pandemic in an effort to limit this patient's exposure and mitigate transmission in our community.   Virtual Visit via Video Note  I connected with Vale Haven on 03/09/19 at 11:45 AM EDT by a video enabled telemedicine application and verified that I am speaking with the correct person using two identifiers.  Location patient: home Location provider:work or home office Persons participating in the virtual visit: patient, provider  I discussed the limitations of evaluation and management by telemedicine and the availability of in person appointments. The patient expressed understanding and agreed to proceed.   HPI: Patient requesting refills of a few medications.  She has chronic insomnia and has been on Ambien for many years.  She has had recent increased stress of decreased work.  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  No regular use of alcohol.  History of perioral dermatitis.  She takes Keflex as needed for flareups that seems to be working well.  She is requesting refills  She sees gynecologist yearly for physicals   ROS: See pertinent positives and negatives per HPI.  Past Medical History:  Diagnosis Date  . Anxiety   . ANXIETY, SITUATIONAL 11/27/2010  . BACK PAIN, UPPER 02/15/2009  . CERVICAL LYMPHADENOPATHY 02/15/2009  . INSOMNIA, CHRONIC 02/15/2009    Past Surgical History:  Procedure Laterality Date  . AUGMENTATION MAMMAPLASTY Bilateral 10/29/2001   augmentation-removal-reinsertion  . Eyebrow lift    . GYNECOLOGIC CRYOSURGERY  1987    Family History  Problem Relation Age of Onset  . Cancer Father        colon  . Breast cancer Maternal Grandmother        Age 97's  . Cancer Mother        Lymphoma    SOCIAL HX:  Non-smoker   Current Outpatient Medications:  .  azithromycin (ZITHROMAX Z-PAK) 250 MG tablet, As directed, Disp: 6 each, Rfl: 0 .  cephALEXin (KEFLEX) 500 MG capsule, Take 1 capsule (500 mg total) by mouth 3 (three) times daily., Disp: 60 capsule, Rfl: 0 .  diclofenac (VOLTAREN) 75 MG EC tablet, Take 1 tablet (75 mg total) by mouth 2 (two) times daily., Disp: 60 tablet, Rfl: 2 .  hydrocortisone valerate cream (WESTCORT) 0.2 %, Apply 1 application topically 2 (two) times daily. On face per dermaatologist, Disp: 45 g, Rfl: 0 .  Multiple Vitamin (MULITIVITAMIN WITH MINERALS) TABS, Take 1 tablet by mouth daily., Disp: , Rfl:  .  norethindrone-ethinyl estradiol (JUNEL 1/20) 1-20 MG-MCG tablet, Take 1 tablet by mouth daily. (Patient not taking: Reported on 12/08/2018), Disp: 3 Package, Rfl: 0 .  spironolactone (ALDACTONE) 100 MG tablet, TAKE 1 TABLET BY MOUTH EVERY DAY, Disp: 60 tablet, Rfl: 0 .  tretinoin (RETIN-A) 0.025 % cream, Apply topically at bedtime., Disp: 45 g, Rfl: 2 .  valACYclovir (VALTREX) 1000 MG tablet, Take 0.5 tablets (500 mg total) by mouth 2 (two) times daily. As need for outbreak, Disp: 30 tablet, Rfl: 1 .  zolpidem (AMBIEN) 10 MG tablet, Take 1 tablet (10 mg total) by mouth at bedtime., Disp: 30 tablet, Rfl: 5  EXAM:  VITALS per patient if applicable:  GENERAL: alert, oriented, appears well and in no acute distress  HEENT: atraumatic, conjunttiva clear, no obvious abnormalities on inspection of external nose  and ears  NECK: normal movements of the head and neck  LUNGS: on inspection no signs of respiratory distress, breathing rate appears normal, no obvious gross SOB, gasping or wheezing  CV: no obvious cyanosis  MS: moves all visible extremities without noticeable abnormality  PSYCH/NEURO: pleasant and cooperative, no obvious depression or anxiety, speech and thought processing grossly intact  ASSESSMENT AND PLAN:  Discussed the following assessment and  plan:  INSOMNIA, CHRONIC  Perioral dermatitis   -Refill Ambien for 6 months -Refill Keflex which she uses infrequently for flareups of perioral dermatitis -Refill Retin-A cream     I discussed the assessment and treatment plan with the patient. The patient was provided an opportunity to ask questions and all were answered. The patient agreed with the plan and demonstrated an understanding of the instructions.   The patient was advised to call back or seek an in-person evaluation if the symptoms worsen or if the condition fails to improve as anticipated.     Evelena PeatBruce Burchette, MD

## 2019-05-13 ENCOUNTER — Other Ambulatory Visit: Payer: Self-pay | Admitting: Family Medicine

## 2019-05-13 NOTE — Telephone Encounter (Signed)
OK to fill for patient? 

## 2019-05-13 NOTE — Telephone Encounter (Signed)
Refill once.  She uses prn for flare ups of peri-oral dermatitis.

## 2019-06-15 ENCOUNTER — Other Ambulatory Visit: Payer: Self-pay | Admitting: Family Medicine

## 2019-06-15 NOTE — Telephone Encounter (Signed)
Refill once 

## 2019-06-15 NOTE — Telephone Encounter (Signed)
OK to fill  for peri-oral dermatitis flares?

## 2019-07-15 ENCOUNTER — Other Ambulatory Visit: Payer: Self-pay | Admitting: Family Medicine

## 2019-07-22 ENCOUNTER — Encounter: Payer: Self-pay | Admitting: Gynecology

## 2019-08-20 ENCOUNTER — Other Ambulatory Visit: Payer: Self-pay | Admitting: Family Medicine

## 2019-08-21 NOTE — Telephone Encounter (Signed)
Pt states she is completely out of medication and would like to have rx refilled as soon as possible, please advise.

## 2019-08-21 NOTE — Telephone Encounter (Signed)
Message routed to PCP CMA  

## 2019-08-29 ENCOUNTER — Other Ambulatory Visit: Payer: Self-pay | Admitting: Family Medicine

## 2019-08-31 ENCOUNTER — Telehealth: Payer: Self-pay

## 2019-08-31 NOTE — Telephone Encounter (Signed)
Pt has been scheduled.  °

## 2019-08-31 NOTE — Telephone Encounter (Signed)
Copied from [place] [id number]. Topic: Appointment Scheduling - Scheduling Inquiry for Clinic >> Aug 31, 2019  2:44 PM Mathis Bud wrote: Reason for CRM: Patient had a fall 11/1.  Her leg is in pain and would like to be seen asap.  Call back [phone] >> Aug 31, 2019  3:06 PM Cox, Melburn Hake, CMA wrote: Could we please use the 10:30am for this pt? She cannot walk or drive and will have to get someone to bring her in. She fell in a storm grate and hurt her leg. There is large "goose egg" swollen area, possible laceration both to shin area and pain to the point of not being able to walk. She denies any redness, bleeding, oozing or fever.

## 2019-09-01 ENCOUNTER — Ambulatory Visit (INDEPENDENT_AMBULATORY_CARE_PROVIDER_SITE_OTHER): Payer: Self-pay

## 2019-09-01 ENCOUNTER — Other Ambulatory Visit: Payer: Self-pay

## 2019-09-01 ENCOUNTER — Ambulatory Visit (INDEPENDENT_AMBULATORY_CARE_PROVIDER_SITE_OTHER): Payer: Self-pay | Admitting: Family Medicine

## 2019-09-01 ENCOUNTER — Encounter: Payer: Self-pay | Admitting: Family Medicine

## 2019-09-01 DIAGNOSIS — S335XXA Sprain of ligaments of lumbar spine, initial encounter: Secondary | ICD-10-CM

## 2019-09-01 DIAGNOSIS — Z23 Encounter for immunization: Secondary | ICD-10-CM

## 2019-09-01 DIAGNOSIS — S80811A Abrasion, right lower leg, initial encounter: Secondary | ICD-10-CM

## 2019-09-01 MED ORDER — HYDROCODONE-ACETAMINOPHEN 5-325 MG PO TABS: 1.0000 | ORAL_TABLET | ORAL | 0 refills | Status: AC | PRN

## 2019-09-01 NOTE — Progress Notes (Signed)
   Subjective:    Patient ID: Sara Camacho, female    DOB: [birth year], 53 y.o.   MRN: [id number]  HPI Here for injuries from a fall that occurred 3 days ago. As she was walking along a sidewalk she stepped on a cover over a water pipe and the cover slipped, causing her to step about 12 inches down inside the hole. She struck the edge of the pipe with her right lower leg and jarred her back. The lower leg swelled up immediately, but some of the swelling has receded since then. She still has a lot of pain in the right lower leg and pain in the lower back. She has been taking Diclofenac and Tylenol with mixed relief.    Review of Systems  Constitutional: Negative.   Respiratory: Negative.   Cardiovascular: Negative.   Musculoskeletal: Positive for arthralgias and back pain.  Neurological: Negative.        Objective:   Physical Exam Constitutional:      Comments: In pain, walks with a limp   Cardiovascular:     Rate and Rhythm: Normal rate and regular rhythm.     Pulses: Normal pulses.     Heart sounds: Normal heart sounds.  Pulmonary:     Effort: Pulmonary effort is normal.     Breath sounds: Normal breath sounds.  Musculoskeletal:     Comments: The right lower shin has an abrasion midway down and the surrounding area is mildly swollen and very tender. thre foot and ankle are intact. The knee is intact. The right hip has full ROM but she has pain on moving it. The lower back is quite tender over the spine but not the sciatic notches. Negative SLR. Xrays today of the lumbar spine and of the right lower leg are negative for fractures.   Neurological:     Mental Status: She is alert.           Assessment & Plan:  Lumbar sprain and right leg abrasion. She will keep the leg elevated and apply ice packs. Take Diclofenac BID and add Norco as needed for pain. Recheck prn.  Alysia Penna, MD

## 2019-10-12 ENCOUNTER — Other Ambulatory Visit: Payer: Self-pay | Admitting: Family Medicine

## 2019-12-10 ENCOUNTER — Other Ambulatory Visit: Payer: Self-pay | Admitting: Family Medicine

## 2019-12-11 NOTE — Telephone Encounter (Signed)
Called patient and gave her the message and patient asked if she could call back Monday to schedule. Patient verbalized an understanding.

## 2019-12-11 NOTE — Telephone Encounter (Signed)
Refilled Ambien for 1 month.  Would like to set up either in office or Doxy to discuss chronic use.  Would like to explore option of other alternatives if possible based on some recent data with concerns for long-term use

## 2020-01-04 ENCOUNTER — Other Ambulatory Visit: Payer: Self-pay

## 2020-01-04 ENCOUNTER — Encounter: Payer: Self-pay | Admitting: Family Medicine

## 2020-01-04 ENCOUNTER — Ambulatory Visit (INDEPENDENT_AMBULATORY_CARE_PROVIDER_SITE_OTHER): Payer: Self-pay | Admitting: Family Medicine

## 2020-01-04 VITALS — BP 108/66 | HR 83 | Temp 97.8°F | Ht 62.5 in | Wt 122.9 lb

## 2020-01-04 DIAGNOSIS — G4700 Insomnia, unspecified: Secondary | ICD-10-CM

## 2020-01-04 DIAGNOSIS — Z79899 Other long term (current) drug therapy: Secondary | ICD-10-CM

## 2020-01-04 DIAGNOSIS — S0083XA Contusion of other part of head, initial encounter: Secondary | ICD-10-CM

## 2020-01-04 DIAGNOSIS — S0181XA Laceration without foreign body of other part of head, initial encounter: Secondary | ICD-10-CM

## 2020-01-04 DIAGNOSIS — L710 Perioral dermatitis: Secondary | ICD-10-CM

## 2020-01-04 LAB — BASIC METABOLIC PANEL
BUN: 26 mg/dL — ABNORMAL HIGH (ref 6–23)
CO2: 30 mEq/L (ref 19–32)
Calcium: 9 mg/dL (ref 8.4–10.5)
Chloride: 101 mEq/L (ref 96–112)
Creatinine, Ser: 1.02 mg/dL (ref 0.40–1.20)
GFR: 56.28 mL/min — ABNORMAL LOW (ref >60.00)
Glucose, Bld: 96 mg/dL (ref 70–99)
Potassium: 4.3 mEq/L (ref 3.5–5.1)
Sodium: 136 mEq/L (ref 135–145)

## 2020-01-04 MED ORDER — SPIRONOLACTONE 100 MG PO TABS: 100.0000 mg | ORAL_TABLET | Freq: Every day | ORAL | 1 refills | Status: DC

## 2020-01-04 MED ORDER — ZOLPIDEM TARTRATE 10 MG PO TABS: ORAL_TABLET | ORAL | 3 refills | Status: DC

## 2020-01-04 NOTE — Progress Notes (Signed)
Subjective:     Patient ID: Sara Camacho, female   DOB: 1966/01/19, 54 y.o.   MRN: [id number]  HPI  Sara Camacho is seen following fall which happened yesterday.  She was walking with a friend and tripped over an uneven sidewalk.  She fell forward and hit her head.  She had bruising right inferior orbital region and also a small laceration of her right forehead.  She had some bleeding controlled with pressure.  She did not go to the ER.  There was no loss of consciousness.  She denies any extremity injury.  Tetanus 11/20.  She has had only mild headache today.  No confusion.  She states she has not had any issues with balance recently.  Her fall was related to uneven sidewalk.  She has history of perioral dermatitis/adult acne.  She had seen dermatology in the past and was prescribed spironolactone and requesting refills.  She has not had any recent electrolytes.  She has chronic insomnia and is maintained on Ambien 10 mg daily.  She has tried going without but has had great difficulties getting to sleep.  Requesting refills  Past Medical History:  Diagnosis Date  . Anxiety   . ANXIETY, SITUATIONAL 11/27/2010  . BACK PAIN, UPPER 02/15/2009  . CERVICAL LYMPHADENOPATHY 02/15/2009  . INSOMNIA, CHRONIC 02/15/2009   Past Surgical History:  Procedure Laterality Date  . AUGMENTATION MAMMAPLASTY Bilateral 10/29/2001   augmentation-removal-reinsertion  . Eyebrow lift    . [place]    reports that she has never smoked. She has never used smokeless tobacco. She reports current alcohol use. She reports that she does not use drugs. family history includes Breast cancer in her maternal grandmother; Cancer in her father and mother. Allergies  Allergen Reactions  . Penicillins Other (See Comments)    Childhood history     Review of Systems  Constitutional: Negative for appetite change, fatigue and unexpected weight change.  Eyes: Negative for visual disturbance.  Respiratory:  Negative for cough, chest tightness, shortness of breath and wheezing.   Cardiovascular: Negative for chest pain, palpitations and leg swelling.  Gastrointestinal: Negative for nausea and vomiting.  Neurological: Negative for dizziness, seizures, syncope, weakness, light-headedness and headaches.  Psychiatric/Behavioral: Negative for confusion.       Objective:   Physical Exam Vitals reviewed.  Constitutional:      Appearance: Normal appearance.  HENT:     Head:     Comments: She has some obvious ecchymosis and swelling right infraorbital region.  She has some crusted blood just above her right brow with very superficial vertical oriented laceration which is sealed together at the edges and nongaping Eyes:     Extraocular Movements: Extraocular movements intact.  Cardiovascular:     Rate and Rhythm: Normal rate and regular rhythm.  Pulmonary:     Effort: Pulmonary effort is normal.     Breath sounds: Normal breath sounds.  Skin:    Findings: No rash.  Neurological:     General: No focal deficit present.     Mental Status: She is alert and oriented to person, place, and time.     Cranial Nerves: No cranial nerve deficit.     Motor: No weakness.     Coordination: Coordination normal.        Assessment:     #1 contusion and laceration right side of face.  Extraocular movement intact.  She has nongaping laceration above the right eyebrow region with no signs of secondary infection.    #  2 chronic insomnia-unchanged  #3 history of perioral dermatitis adult acne improved on spironolactone which she was placed on a few years ago by dermatology    Plan:     -We recommend she try to taper back Ambien to 5 mg daily and then hopefully off over the next few months  -Wound care instruction given.  Keep wound clean with soap and water and topical Vaseline daily and follow-up for signs of secondary infection  -Follow-up for any progressive headache or other concerns  -We will  refill spironolactone for 1 year.  Check basic metabolic panel  Kristian Covey MD [facility]

## 2020-01-04 NOTE — Patient Instructions (Signed)
Keep wound clean with soap and water  Topical vaseline 1-2 times daily  Follow up for any signs of secondary infection.

## 2020-01-05 ENCOUNTER — Other Ambulatory Visit: Payer: Self-pay | Admitting: Family Medicine

## 2020-02-08 ENCOUNTER — Other Ambulatory Visit: Payer: Self-pay | Admitting: Family Medicine

## 2020-05-05 ENCOUNTER — Other Ambulatory Visit: Payer: Self-pay | Admitting: Family Medicine

## 2020-05-05 NOTE — Telephone Encounter (Signed)
Please advise 

## 2020-05-31 ENCOUNTER — Other Ambulatory Visit: Payer: Self-pay | Admitting: Family Medicine

## 2020-05-31 NOTE — Telephone Encounter (Signed)
Refill once.  She takes this for perioral dermatitis.

## 2020-05-31 NOTE — Telephone Encounter (Signed)
Please advise 

## 2020-08-03 ENCOUNTER — Encounter: Payer: Self-pay | Admitting: Family Medicine

## 2020-08-03 ENCOUNTER — Telehealth (INDEPENDENT_AMBULATORY_CARE_PROVIDER_SITE_OTHER): Payer: Self-pay | Admitting: Family Medicine

## 2020-08-03 DIAGNOSIS — R059 Cough, unspecified: Secondary | ICD-10-CM

## 2020-08-03 MED ORDER — BENZONATATE 100 MG PO CAPS
ORAL_CAPSULE | ORAL | 0 refills | Status: DC
Start: 2020-08-03 — End: 2021-05-30

## 2020-08-03 NOTE — Progress Notes (Signed)
Patient ID: Sara Camacho, female   DOB: [birth year], 54 y.o.   MRN: [id number]   This visit type was conducted due to national recommendations for restrictions regarding the COVID-19 pandemic in an effort to limit this patient's exposure and mitigate transmission in our community.   Virtual Visit via Video Note  I connected with Vale Haven on 08/03/20 at  3:30 PM EDT by a video enabled telemedicine application and verified that I am speaking with the correct person using two identifiers.  Location patient: home Location provider:work or home office Persons participating in the virtual visit: patient, provider  I discussed the limitations of evaluation and management by telemedicine and the availability of in person appointments. The patient expressed understanding and agreed to proceed.   HPI: Sara Camacho called with approximately 21-month history of nonproductive coughing.  She is a non-smoker.  She has been getting some housework done recently and wonders if the dust produced from that may have been a trigger.  She is not aware of any wheezing.  No obvious GERD symptoms.  No postnasal drip symptoms.  No fever.  Denies any appetite or weight changes.  No dyspnea.  She has not had any body aches or other symptoms suggestive of recent Covid and no sick contacts.  No history of asthma.  She is using frequent Hall's cough drops and we have cautioned her about mentholated or mint products and how they could trigger prolonged coughing.  She had some leftover hydrocodone cough syrup which she took at night which helps slightly   ROS: See pertinent positives and negatives per HPI.  Past Medical History:  Diagnosis Date  . Anxiety   . ANXIETY, SITUATIONAL 11/27/2010  . BACK PAIN, UPPER 02/15/2009  . CERVICAL LYMPHADENOPATHY 02/15/2009  . INSOMNIA, CHRONIC 02/15/2009    Past Surgical History:  Procedure Laterality Date  . AUGMENTATION MAMMAPLASTY Bilateral 10/29/2001   augmentation-removal-reinsertion   . Eyebrow lift    . GYNECOLOGIC CRYOSURGERY  1987    Family History  Problem Relation Age of Onset  . Cancer Father        colon  . Breast cancer Maternal Grandmother        Age 21's  . Cancer Mother        Lymphoma    SOCIAL HX: Non-smoker   Current Outpatient Medications:  .  cephALEXin (KEFLEX) 500 MG capsule, Take 500 mg by mouth 4 (four) times daily., Disp: , Rfl:  .  diclofenac (VOLTAREN) 75 MG EC tablet, TAKE 1 TABLET BY MOUTH TWICE A DAY, Disp: 60 tablet, Rfl: 2 .  hydrocortisone valerate cream (WESTCORT) 0.2 %, Apply 1 application topically 2 (two) times daily. On face per dermaatologist, Disp: 45 g, Rfl: 0 .  Multiple Vitamin (MULITIVITAMIN WITH MINERALS) TABS, Take 1 tablet by mouth daily., Disp: , Rfl:  .  spironolactone (ALDACTONE) 100 MG tablet, TAKE 1 TABLET DAILY, Disp: 90 tablet, Rfl: 1 .  tretinoin (RETIN-A) 0.025 % cream, Apply topically at bedtime., Disp: 45 g, Rfl: 2 .  valACYclovir (VALTREX) 1000 MG tablet, Take 0.5 tablets (500 mg total) by mouth 2 (two) times daily. As need for outbreak, Disp: 30 tablet, Rfl: 1 .  zolpidem (AMBIEN) 10 MG tablet, TAKE 1 TABLET BY MOUTH EVERYDAY AT BEDTIME, Disp: 30 tablet, Rfl: 3  EXAM:  VITALS per patient if applicable:  GENERAL: alert, oriented, appears well and in no acute distress  HEENT: atraumatic, conjunttiva clear, no obvious abnormalities on inspection of external nose and ears  NECK:  normal movements of the head and neck  LUNGS: on inspection no signs of respiratory distress, breathing rate appears normal, no obvious gross SOB, gasping or wheezing  CV: no obvious cyanosis  MS: moves all visible extremities without noticeable abnormality  PSYCH/NEURO: pleasant and cooperative, no obvious depression or anxiety, speech and thought processing grossly intact  ASSESSMENT AND PLAN:  Discussed the following assessment and plan:  Cough for approximately 8 weeks.  She denies any red flag symptoms such as  fever, dyspnea, hemoptysis, weight loss.  Non-smoker.  No obvious GERD or postnasal drip symptoms.  No obvious wheezing.  -We discussed trial of Tessalon Perles 100 mg 1-2 every 8 hours as needed for cough -If that is not working we can refill her hydrocodone cough syrup -We recommended in office evaluation by next week if not improving.  May need chest x-ray and further evaluation if not improved -Reduce mentholated cough drops.  We discussed other potential triggers such as silent GERD.     I discussed the assessment and treatment plan with the patient. The patient was provided an opportunity to ask questions and all were answered. The patient agreed with the plan and demonstrated an understanding of the instructions.   The patient was advised to call back or seek an in-person evaluation if the symptoms worsen or if the condition fails to improve as anticipated.     Sara Peat, MD

## 2020-08-31 ENCOUNTER — Other Ambulatory Visit: Payer: Self-pay | Admitting: Family Medicine

## 2020-08-31 MED ORDER — HYDROCODONE-HOMATROPINE 5-1.5 MG/5ML PO SYRP: 5.0000 mL | ORAL_SOLUTION | Freq: Four times a day (QID) | ORAL | 0 refills | Status: AC | PRN

## 2020-08-31 NOTE — Telephone Encounter (Signed)
Pt call and stated she still have the cough and the cough syrup that she got don't work and she want a RX  hydrocodone cough syrup [facility] in to  [id number] [place], [place] - [address] Phone:  [phone]  Fax:  [phone]

## 2020-08-31 NOTE — Addendum Note (Signed)
Addended by: Kristian Covey on: 08/31/2020 11:11 PM   Modules accepted: Orders

## 2020-08-31 NOTE — Telephone Encounter (Signed)
Sent in Hycodan cough syrup and needs office follow up if not better in 2 weeks.

## 2020-08-31 NOTE — Telephone Encounter (Signed)
Pt was last seen on 08/03/2020. Ok to send Rx or schedule pt for a visit

## 2020-09-01 ENCOUNTER — Telehealth: Payer: Self-pay | Admitting: Family Medicine

## 2020-09-01 NOTE — Telephone Encounter (Signed)
Pt is requesting a refill on this meds.

## 2020-09-01 NOTE — Telephone Encounter (Signed)
Patient would like a refill Zolpidem. Sent to: [id number] [place], [place] - [address] Phone:  [phone]  Fax:  [phone]

## 2020-09-02 MED ORDER — ZOLPIDEM TARTRATE 10 MG PO TABS: ORAL_TABLET | ORAL | 3 refills | Status: DC

## 2020-09-26 ENCOUNTER — Other Ambulatory Visit: Payer: Self-pay | Admitting: Family Medicine

## 2020-09-27 NOTE — Telephone Encounter (Signed)
Refill once okay.  She uses this for perioral dermatitis.

## 2020-12-28 ENCOUNTER — Other Ambulatory Visit: Payer: Self-pay | Admitting: Family Medicine

## 2020-12-29 NOTE — Telephone Encounter (Signed)
Last refill- 09/02/2020--30 tabs 3 refills Last video visit- 08/03/2020  No future appointment scheduled

## 2021-02-02 ENCOUNTER — Other Ambulatory Visit: Payer: Self-pay | Admitting: Family Medicine

## 2021-02-02 NOTE — Telephone Encounter (Signed)
Please advise. Should the patient continue this medication? 

## 2021-02-02 NOTE — Telephone Encounter (Signed)
Refill once OK. 

## 2021-03-01 ENCOUNTER — Other Ambulatory Visit: Payer: Self-pay | Admitting: Family Medicine

## 2021-03-02 NOTE — Telephone Encounter (Signed)
Last filled 12/29/2020 Last OV 08/03/2020 (acute) Due for a physical.

## 2021-05-03 ENCOUNTER — Other Ambulatory Visit: Payer: Self-pay | Admitting: Family Medicine

## 2021-05-03 NOTE — Telephone Encounter (Signed)
Patient is needing a refill on zolpidem (AMBIEN) 10 MG tablet today. She is going out of town tomorrow.  [id number] [place], [place] - [address] Phone:  [phone]  Fax:  [phone]

## 2021-05-03 NOTE — Telephone Encounter (Signed)
Last filled 03/02/2021 Last OV 08/03/2021 (acute) Due for a physical.   Ok to fill?

## 2021-05-25 ENCOUNTER — Other Ambulatory Visit: Payer: Self-pay | Admitting: Family Medicine

## 2021-05-26 ENCOUNTER — Telehealth: Payer: Self-pay | Admitting: Family Medicine

## 2021-05-26 MED ORDER — CEPHALEXIN 500 MG PO CAPS: ORAL_CAPSULE | ORAL | 0 refills | Status: DC

## 2021-05-26 NOTE — Telephone Encounter (Signed)
Patient wants a refill of cephALEXin (KEFLEX) 500 MG capsule to be sent to [id number] - [place], [place] - [address] .   She states that she was told to make an appointment and has one scheduled for Tuesday, August 2nd for a medication refill.  Please advise.

## 2021-05-26 NOTE — Telephone Encounter (Signed)
RX sent to pts pharmacy.  

## 2021-05-30 ENCOUNTER — Ambulatory Visit (INDEPENDENT_AMBULATORY_CARE_PROVIDER_SITE_OTHER): Payer: Self-pay | Admitting: Family Medicine

## 2021-05-30 ENCOUNTER — Other Ambulatory Visit: Payer: Self-pay

## 2021-05-30 ENCOUNTER — Encounter: Payer: Self-pay | Admitting: Family Medicine

## 2021-05-30 VITALS — BP 102/68 | HR 77 | Temp 97.9°F | Wt 116.1 lb

## 2021-05-30 DIAGNOSIS — F5104 Psychophysiologic insomnia: Secondary | ICD-10-CM

## 2021-05-30 DIAGNOSIS — F419 Anxiety disorder, unspecified: Secondary | ICD-10-CM

## 2021-05-30 DIAGNOSIS — L814 Other melanin hyperpigmentation: Secondary | ICD-10-CM

## 2021-05-30 DIAGNOSIS — R053 Chronic cough: Secondary | ICD-10-CM

## 2021-05-30 MED ORDER — LORAZEPAM 0.5 MG PO TABS: 0.5000 mg | ORAL_TABLET | Freq: Two times a day (BID) | ORAL | 0 refills | Status: AC | PRN

## 2021-05-30 MED ORDER — HYDROQUINONE 4 % EX CREA: TOPICAL_CREAM | Freq: Two times a day (BID) | CUTANEOUS | 1 refills | Status: AC

## 2021-05-30 NOTE — Progress Notes (Signed)
Established Patient Office Visit  Subjective:  Patient ID: Sara Camacho, female    DOB: Mar 12, 1966  Age: 56 y.o. MRN: [id number]  CC:  Chief Complaint  Patient presents with   Cough   Anxiety    HPI Sara Camacho presents to discuss several things as follows  Chronic cough really for well over a year.  Cough is somewhat intermittent.  No clear triggers.  Never smoked.  No hemoptysis.  No dyspnea.  No obvious wheezing.  No ACE inhibitor use.  She is not aware of any GERD symptoms.  May have occasional postnasal drip symptoms.  Has not had recent chest x-ray.  She currently has no insurance coverage and declines.  She has intermittent anxiety symptoms.  Mother recently had extensive basal cell on her face which required extensive surgery.  She has been helping to care for her.  Had a lot of anxiety and stress related to that.  Strained relationship with her at times.  She has taken very infrequent lorazepam in the past and requesting refill.  She has some hyperpigmented spots on her face and in the past has taken hydroquinone 4% cream.  She is requesting refills.  She has tolerated well without side effects in the past.  She has chronic insomnia.  She is currently on Ambien 10 mg nightly and has been on this for years.  She recently was in the process of trying to taper back and off of this until her mom's illness.  She has had tremendous difficulty sleeping recently.  Past Medical History:  Diagnosis Date   Anxiety    ANXIETY, SITUATIONAL 11/27/2010   BACK PAIN, UPPER 02/15/2009   CERVICAL LYMPHADENOPATHY 02/15/2009   INSOMNIA, CHRONIC 02/15/2009    Past Surgical History:  Procedure Laterality Date   AUGMENTATION MAMMAPLASTY Bilateral 10/29/2001   augmentation-removal-reinsertion   Eyebrow lift     GYNECOLOGIC CRYOSURGERY  1987    Family History  Problem Relation Age of Onset   Cancer Father        colon   Breast cancer Maternal Grandmother        Age 68's   Cancer Mother         Lymphoma    Social History   Socioeconomic History   Marital status: Single    Spouse name: Not on file   Number of children: Not on file   Years of education: Not on file   Highest education level: Not on file  Occupational History   Not on file  Tobacco Use   Smoking status: Never   Smokeless tobacco: Never  Vaping Use   Vaping Use: Never used  Substance and Sexual Activity   Alcohol use: Yes    Alcohol/week: 0.0 standard drinks    Comment: rare   Drug use: No   Sexual activity: Yes    Birth control/protection: Pill    Comment: 1st intercourse 55 yo-Fewer than 5 partners  Other Topics Concern   Not on file  Social History Narrative   Not on file   Social Determinants of Health   Financial Resource Strain: Not on file  Food Insecurity: Not on file  Transportation Needs: Not on file  Physical Activity: Not on file  Stress: Not on file  Social Connections: Not on file  Intimate Partner Violence: Not on file    Outpatient Medications Prior to Visit  Medication Sig Dispense Refill   cephALEXin (KEFLEX) 500 MG capsule TAKE 1 CAPSULE BY MOUTH THREE TIMES A  DAY 60 capsule 0   diclofenac (VOLTAREN) 75 MG EC tablet TAKE 1 TABLET BY MOUTH TWICE A DAY 60 tablet 2   hydrocortisone valerate cream (WESTCORT) 0.2 % Apply 1 application topically 2 (two) times daily. On face per dermaatologist 45 g 0   Multiple Vitamin (MULITIVITAMIN WITH MINERALS) TABS Take 1 tablet by mouth daily.     spironolactone (ALDACTONE) 100 MG tablet TAKE 1 TABLET DAILY 90 tablet 1   tretinoin (RETIN-A) 0.025 % cream Apply topically at bedtime. 45 g 2   valACYclovir (VALTREX) 1000 MG tablet Take 0.5 tablets (500 mg total) by mouth 2 (two) times daily. As need for outbreak 30 tablet 1   zolpidem (AMBIEN) 10 MG tablet TAKE ONE TABLET BY MOUTH EVERY NIGHT AS NEEDED FOR INSOMNIA. 30 tablet 1   benzonatate (TESSALON) 100 MG capsule Take 1 to 2 capsules every 8 hours as needed for cough (Patient not  taking: Reported on 05/30/2021) 40 capsule 0   No facility-administered medications prior to visit.    Allergies  Allergen Reactions   Penicillins Other (See Comments)    Childhood history    ROS Review of Systems  Constitutional:  Negative for chills and fever.  Respiratory:  Positive for cough. Negative for shortness of breath and wheezing.   Cardiovascular:  Negative for chest pain and leg swelling.  Psychiatric/Behavioral:  Negative for dysphoric mood. The patient is nervous/anxious.      Objective:    Physical Exam Vitals reviewed.  Constitutional:      Appearance: Normal appearance.  Cardiovascular:     Rate and Rhythm: Normal rate and regular rhythm.  Pulmonary:     Effort: Pulmonary effort is normal.     Breath sounds: Normal breath sounds.  Musculoskeletal:     Cervical back: Neck supple.  Lymphadenopathy:     Cervical: No cervical adenopathy.  Skin:    Comments: She has a few scattered solar lentigo type lesions on her face and upper extremities  Neurological:     Mental Status: She is alert.    BP 102/68 (BP Location: Left Arm, Patient Position: Sitting, Cuff Size: Normal)   Pulse 77   Temp 97.9 F (36.6 C) (Oral)   Wt 116 lb 1.6 oz (52.7 kg)   SpO2 99%   BMI 20.90 kg/m  Wt Readings from Last 3 Encounters:  05/30/21 116 lb 1.6 oz (52.7 kg)  01/04/20 122 lb 14.4 oz (55.7 kg)  12/08/18 124 lb 2 oz (56.3 kg)     There are no preventive care reminders to display for this patient.  There are no preventive care reminders to display for this patient.  Lab Results  Component Value Date   TSH 1.399 11/09/2015   Lab Results  Component Value Date   WBC 9.2 08/31/2016   HGB 12.7 08/31/2016   HCT 38.0 08/31/2016   MCV 92.5 08/31/2016   PLT 409 (H) 08/31/2016   Lab Results  Component Value Date   NA 136 01/04/2020   K 4.3 01/04/2020   CO2 30 01/04/2020   GLUCOSE 96 01/04/2020   BUN 26 (H) 01/04/2020   CREATININE 1.02 01/04/2020   BILITOT 0.6  08/31/2016   ALKPHOS 30 (L) 08/31/2016   AST 11 08/31/2016   ALT 7 08/31/2016   PROT 7.0 08/31/2016   ALBUMIN 3.9 08/31/2016   CALCIUM 9.0 01/04/2020   GFR 56.28 (L) 01/04/2020   Lab Results  Component Value Date   CHOL 215 (H) 11/26/2016   Lab Results  Component Value Date   HDL 62 11/26/2016   Lab Results  Component Value Date   LDLCALC 103 (H) 11/26/2016   Lab Results  Component Value Date   TRIG 249 (H) 11/26/2016   Lab Results  Component Value Date   CHOLHDL 3.5 11/26/2016   No results found for: HGBA1C    Assessment & Plan:   #1 chronic cough.  She does not have any red flags such as hemoptysis, dyspnea, fever.  Symptoms really been intermittent for well over a year.  We discussed getting a chest x-ray but she declines because of lack of insurance coverage.  We discussed differential for chronic cough including silent GERD, postnasal drip, unrecognized asthma.  She has not had any evidence or wheezing.  -Suggested trial of chlorpheniramine 4 mg nightly -Consider trial of over-the-counter Prilosec or Nexium 20 mg daily -Avoid eating within 2 to 3 hours of bedtime -Avoid any mint products  #2 situational anxiety. -Recommend avoidance of regular benzodiazepines. -We agreed to 1 refill of lorazepam 0.5 mg 1 every 8 hours as needed only for severe anxiety symptoms.  She has taken Lexapro in the past but declines going back on that currently.  #3 chronic insomnia -Patient on Ambien 10 mg nightly.  We have suggest that she try to taper off eventually  #4 benign-appearing hyperpigmentation patches on her skin -Refill hydroquinone 4% cream to use twice daily as needed    Follow-up: No follow-ups on file.    Evelena Peat, MD

## 2021-05-30 NOTE — Patient Instructions (Signed)
Consider over the counter Chlorpheniramine 4 mg at night  Consider trial of over the counter Nexium or Prilosec 20 mg once daily.

## 2021-07-07 ENCOUNTER — Other Ambulatory Visit: Payer: Self-pay | Admitting: Family Medicine

## 2021-07-07 NOTE — Telephone Encounter (Signed)
Last filled 05/03/2021 Last OV 05/30/2021  Ok to fill?

## 2021-07-20 ENCOUNTER — Other Ambulatory Visit: Payer: Self-pay | Admitting: Family Medicine

## 2021-07-20 NOTE — Telephone Encounter (Signed)
Please review in Dr. Lucie Leather absence.  Should the patient continue this medication?

## 2021-09-08 ENCOUNTER — Other Ambulatory Visit: Payer: Self-pay | Admitting: Family Medicine

## 2021-09-08 NOTE — Telephone Encounter (Signed)
Please advise. Should the patient continue this.  

## 2021-10-09 ENCOUNTER — Other Ambulatory Visit: Payer: Self-pay | Admitting: Family Medicine

## 2021-10-09 NOTE — Telephone Encounter (Signed)
Last filled 07/09/2021 Last OV 05/30/2021  Ok to fill?

## 2021-11-06 ENCOUNTER — Other Ambulatory Visit: Payer: Self-pay | Admitting: Family Medicine

## 2021-11-07 NOTE — Telephone Encounter (Signed)
Please advise. Should the patient continue this.  

## 2022-01-03 ENCOUNTER — Other Ambulatory Visit: Payer: Self-pay | Admitting: Family Medicine

## 2022-03-12 ENCOUNTER — Other Ambulatory Visit: Payer: Self-pay | Admitting: Family Medicine

## 2022-03-29 ENCOUNTER — Other Ambulatory Visit: Payer: Self-pay

## 2022-03-29 DIAGNOSIS — N644 Mastodynia: Secondary | ICD-10-CM

## 2022-04-04 ENCOUNTER — Other Ambulatory Visit: Payer: Self-pay | Admitting: Family Medicine

## 2022-05-04 ENCOUNTER — Other Ambulatory Visit: Payer: Self-pay | Admitting: Family Medicine

## 2022-05-04 NOTE — Telephone Encounter (Signed)
Rx done. 

## 2022-05-10 ENCOUNTER — Ambulatory Visit: Payer: Self-pay | Admitting: *Deleted

## 2022-05-10 ENCOUNTER — Ambulatory Visit: Admission: RE | Admit: 2022-05-10 | Payer: Self-pay | Source: Ambulatory Visit

## 2022-05-10 ENCOUNTER — Ambulatory Visit
Admission: RE | Admit: 2022-05-10 | Discharge: 2022-05-10 | Disposition: A | Discharge status: Home / Self Care | Payer: No Typology Code available for payment source | Source: Ambulatory Visit | Attending: Obstetrics and Gynecology | Admitting: Obstetrics and Gynecology

## 2022-05-10 VITALS — BP 120/74 | Wt 117.9 lb

## 2022-05-10 DIAGNOSIS — Z1211 Encounter for screening for malignant neoplasm of colon: Secondary | ICD-10-CM

## 2022-05-10 DIAGNOSIS — N644 Mastodynia: Secondary | ICD-10-CM

## 2022-05-10 DIAGNOSIS — Z01419 Encounter for gynecological examination (general) (routine) without abnormal findings: Secondary | ICD-10-CM

## 2022-05-10 NOTE — Patient Instructions (Signed)
Explained breast self awareness with Maryruth Hancock. Pap smear completed today. Let her know [facility] will cover Pap smears and HPV typing every 5 years unless has a history of abnormal Pap smears. Referred patient to the [facility] for a diagnostic mammogram. Appointment scheduled Thursday, May 10, 2022 at 1520. Patient aware of appointment and will be there. Let patient know will follow up with her within the next couple weeks with results of Pap smear by phone. Maryruth Hancock verbalized understanding.  Christon Gallaway, Kathaleen Maser, RN 1:18 PM

## 2022-05-10 NOTE — Progress Notes (Signed)
[name] is a 56 y.o. G1P0010 female who presents to [facility] clinic today with complaint of diffuse right breast pain x 6 months that comes and goes. Patient rates the pain at a 3-4 out of 10.    Pap Smear: Pap smear completed today. Last Pap smear was 11/15/2014 at [facility] clinic and was normal. Per patient has history of an abnormal Pap smear 37 years ago that a colposcopy and cryotherapy was completed for follow up. Patient stated that all Pap smears have been normal since cryotherapy and that she has had at least three normal Pap smears. Last Pap smear result is available in Epic.   Physical exam: Breasts Breasts symmetrical. No skin abnormalities bilateral breasts. No nipple retraction bilateral breasts. No nipple discharge bilateral breasts. No lymphadenopathy. No lumps palpated bilateral breasts. No complaints of pain or tenderness on exam.     Pelvic/Bimanual Ext Genitalia No lesions, no swelling and no discharge observed on external genitalia.        Vagina Vagina pink and normal texture. No lesions or discharge observed in vagina.        Cervix Cervix is present. Cervix pink and of normal texture. No discharge observed.    Uterus Uterus is present and palpable. Uterus in normal position and normal size.        Adnexae Bilateral ovaries present and palpable. No tenderness on palpation.         Rectovaginal No rectal exam completed today since patient had no rectal complaints. No skin abnormalities observed on exam.     Smoking History: Patient has never smoked.   Patient Navigation: Patient education provided. Access to services provided for patient through [facility] program.   Colorectal Cancer Screening: Per patient has never had colonoscopy completed. FIT Test given to patient to complete. No complaints today.    Breast and Cervical Cancer Risk Assessment: Patient has family history of her maternal grandmother and maternal great aunt having  breast cancer. Patient has no known genetic mutations or radiation treatment to the chest before age 72. Per patient has history of cervical dysplasia. Patient has no history of being immunocompromised or DES exposure in-utero.  Risk Assessment     Risk Scores       05/10/2022   Last edited by: Narda Rutherford, LPN   5-year risk: 1.7 %   Lifetime risk: 10.2 %            A: [facility] exam with pap smear Complaint of right breast pain.  P: Referred patient to the [facility] for a diagnostic mammogram. Appointment scheduled Thursday, May 10, 2022 at 1520.  Priscille Heidelberg, RN 05/10/2022 1:18 PM

## 2022-05-16 LAB — CYTOLOGY - PAP
Comment: NEGATIVE
Diagnosis: NEGATIVE
High risk HPV: NEGATIVE

## 2022-05-17 ENCOUNTER — Telehealth: Payer: Self-pay

## 2022-05-17 NOTE — Telephone Encounter (Signed)
Patient informed negative Pap/HPV results, next pap due in 3-5 years Patient verbalized understanding.  

## 2022-05-30 ENCOUNTER — Telehealth: Payer: Self-pay

## 2022-05-30 LAB — FECAL OCCULT BLOOD, IMMUNOCHEMICAL: Fecal Occult Bld: NEGATIVE

## 2022-05-30 NOTE — Progress Notes (Signed)
Please call patient with negative FIT Test results.

## 2022-05-30 NOTE — Telephone Encounter (Signed)
Attempted to contact patient regarding lab results (FIT Test). Left message on voicemail requesting a return call. °

## 2022-06-04 ENCOUNTER — Telehealth: Payer: Self-pay

## 2022-06-04 NOTE — Telephone Encounter (Signed)
Attempted to contact patient regarding lab results. Left message on voicemail requesting a return call.  

## 2022-06-07 ENCOUNTER — Telehealth: Payer: Self-pay

## 2022-06-07 NOTE — Telephone Encounter (Signed)
Patient informed negative FIT test results, verbalized understanding.  

## 2022-06-28 ENCOUNTER — Other Ambulatory Visit: Payer: Self-pay | Admitting: Family Medicine

## 2022-06-29 ENCOUNTER — Telehealth: Payer: Self-pay | Admitting: Family Medicine

## 2022-06-29 MED ORDER — ZOLPIDEM TARTRATE 10 MG PO TABS: ORAL_TABLET | ORAL | 0 refills | Status: DC

## 2022-06-29 NOTE — Telephone Encounter (Signed)
Pt call and made her appt for a  cpe on 07/13/22 and need her refill  for zolpidem (AMBIEN) 10 MG tablet [facility]  [id number] [place], [place] - [address] Phone:  [phone]  Fax:  [phone]      Department: [facility]

## 2022-07-02 ENCOUNTER — Other Ambulatory Visit: Payer: Self-pay | Admitting: Family Medicine

## 2022-07-13 ENCOUNTER — Ambulatory Visit (INDEPENDENT_AMBULATORY_CARE_PROVIDER_SITE_OTHER): Payer: Self-pay | Admitting: Family Medicine

## 2022-07-13 ENCOUNTER — Encounter: Payer: Self-pay | Admitting: Family Medicine

## 2022-07-13 VITALS — BP 110/64 | HR 85 | Temp 96.9°F | Ht 62.6 in | Wt 117.4 lb

## 2022-07-13 DIAGNOSIS — Z23 Encounter for immunization: Secondary | ICD-10-CM

## 2022-07-13 DIAGNOSIS — Z0000 Encounter for general adult medical examination without abnormal findings: Secondary | ICD-10-CM

## 2022-07-13 LAB — HEPATIC FUNCTION PANEL
ALT: 14 U/L (ref 0–35)
AST: 18 U/L (ref 0–37)
Albumin: 4.4 g/dL (ref 3.5–5.2)
Alkaline Phosphatase: 54 U/L (ref 39–117)
Bilirubin, Direct: 0.1 mg/dL (ref 0.0–0.3)
Total Bilirubin: 0.9 mg/dL (ref 0.2–1.2)
Total Protein: 7.7 g/dL (ref 6.0–8.3)

## 2022-07-13 LAB — LIPID PANEL
Cholesterol: 235 mg/dL — ABNORMAL HIGH (ref 0–200)
HDL: 63.1 mg/dL (ref >39.00)
LDL Cholesterol: 150 mg/dL — ABNORMAL HIGH (ref 0–99)
NonHDL: 171.86
Total CHOL/HDL Ratio: 4
Triglycerides: 110 mg/dL (ref 0.0–149.0)
VLDL: 22 mg/dL (ref 0.0–40.0)

## 2022-07-13 LAB — CBC WITH DIFFERENTIAL/PLATELET
Basophils Absolute: 0 10*3/uL (ref 0.0–0.1)
Basophils Relative: 0.4 % (ref 0.0–3.0)
Eosinophils Absolute: 0.1 10*3/uL (ref 0.0–0.7)
Eosinophils Relative: 1.3 % (ref 0.0–5.0)
HCT: 39.6 % (ref 36.0–46.0)
Hemoglobin: 13.5 g/dL (ref 12.0–15.0)
Lymphocytes Relative: 35.6 % (ref 12.0–46.0)
Lymphs Abs: 2 10*3/uL (ref 0.7–4.0)
MCHC: 34.1 g/dL (ref 30.0–36.0)
MCV: 92.9 fl (ref 78.0–100.0)
Monocytes Absolute: 0.6 10*3/uL (ref 0.1–1.0)
Monocytes Relative: 11 % (ref 3.0–12.0)
Neutro Abs: 2.8 10*3/uL (ref 1.4–7.7)
Neutrophils Relative %: 51.7 % (ref 43.0–77.0)
Platelets: 341 10*3/uL (ref 150.0–400.0)
RBC: 4.26 Mil/uL (ref 3.87–5.11)
RDW: 13.5 % (ref 11.5–15.5)
WBC: 5.5 10*3/uL (ref 4.0–10.5)

## 2022-07-13 LAB — BASIC METABOLIC PANEL
BUN: 16 mg/dL (ref 6–23)
CO2: 28 mEq/L (ref 19–32)
Calcium: 9.6 mg/dL (ref 8.4–10.5)
Chloride: 99 mEq/L (ref 96–112)
Creatinine, Ser: 0.85 mg/dL (ref 0.40–1.20)
GFR: 76.03 mL/min (ref >60.00)
Glucose, Bld: 89 mg/dL (ref 70–99)
Potassium: 4.1 mEq/L (ref 3.5–5.1)
Sodium: 133 mEq/L — ABNORMAL LOW (ref 135–145)

## 2022-07-13 LAB — TSH: TSH: 2.03 u[IU]/mL (ref 0.35–5.50)

## 2022-07-13 NOTE — Addendum Note (Signed)
Addended by: Christy Sartorius on: 07/13/2022 12:44 PM   Modules accepted: Orders

## 2022-07-13 NOTE — Progress Notes (Signed)
Established Patient Office Visit  Subjective   Patient ID: Sara Camacho, female    DOB: [birth year]  Age: 56 y.o. MRN: [id number]  Chief Complaint  Patient presents with   Annual Exam    HPI   Sara Camacho is seen today for physical exam.  Generally very healthy.  She has history of some chronic insomnia and history of perioral dermatitis.  Her mother passed away during the past year from complications of squamous cell carcinoma of the skin which started on her face and apparently was very aggressive.  She has been very busy taking care of estate details.   She has seen GYN in the past though her GYN just recently retired.  Maintenance reviewed:  -Prior hepatitis C screen negative -Tetanus due 2030 -Mammogram up-to-date -Pap smear up-to-date -No history of colonoscopy.  She had recent fecal Hemoccults which were negative. -Flu vaccine given today -No history of shingles vaccine  Family history-her father reportedly had history of gallbladder cancer.  Mother had lymphoma and what sounds like squamous cell carcinoma of the skin.  She has 1 sister and 2 brothers but does not stay in touch with her siblings very much.  Social history-she is single.  No children.  Non-smoker.  No regular alcohol.  Past Medical History:  Diagnosis Date   Anxiety    ANXIETY, SITUATIONAL 11/27/2010   BACK PAIN, UPPER 02/15/2009   CERVICAL LYMPHADENOPATHY 02/15/2009   INSOMNIA, CHRONIC 02/15/2009   Past Surgical History:  Procedure Laterality Date   AUGMENTATION MAMMAPLASTY Bilateral 10/29/2001   augmentation-removal-reinsertion   Eyebrow lift     GYNECOLOGIC CRYOSURGERY  1987    reports that she has never smoked. She has never used smokeless tobacco. She reports current alcohol use. She reports that she does not use drugs. family history includes Breast cancer in her maternal grandmother; Cancer in her father and mother. Allergies  Allergen Reactions   Penicillins Other (See Comments)    Childhood  history    Review of Systems  Constitutional:  Negative for chills, fever, malaise/fatigue and weight loss.  HENT:  Negative for hearing loss.   Eyes:  Negative for blurred vision and double vision.  Respiratory:  Negative for cough and shortness of breath.   Cardiovascular:  Negative for chest pain, palpitations and leg swelling.  Gastrointestinal:  Negative for abdominal pain, blood in stool, constipation and diarrhea.  Genitourinary:  Negative for dysuria.  Skin:  Negative for rash.  Neurological:  Negative for dizziness, speech change, seizures, loss of consciousness and headaches.  Psychiatric/Behavioral:  Negative for depression.       Objective:     BP 110/64 (BP Location: Left Arm, Patient Position: Sitting, Cuff Size: Normal)   Pulse 85   Temp (!) 96.9 F (36.1 C) (Oral)   Ht 5' 2.6" (1.59 m)   Wt 117 lb 6.4 oz (53.3 kg)   SpO2 99%   BMI 21.06 kg/m    Physical Exam Vitals reviewed.  Constitutional:      Appearance: She is well-developed.  HENT:     Head: Normocephalic and atraumatic.  Eyes:     Pupils: Pupils are equal, round, and reactive to light.  Neck:     Thyroid: No thyromegaly.  Cardiovascular:     Rate and Rhythm: Normal rate and regular rhythm.     Heart sounds: Normal heart sounds. No murmur heard. Pulmonary:     Effort: No respiratory distress.     Breath sounds: Normal breath sounds. No wheezing or  rales.  Abdominal:     General: Bowel sounds are normal. There is no distension.     Palpations: Abdomen is soft. There is no mass.     Tenderness: There is no abdominal tenderness. There is no guarding or rebound.  Musculoskeletal:        General: Normal range of motion.     Cervical back: Normal range of motion and neck supple.     Right lower leg: No edema.     Left lower leg: No edema.  Lymphadenopathy:     Cervical: No cervical adenopathy.  Skin:    Findings: No rash.  Neurological:     Mental Status: She is alert and oriented to  person, place, and time.     Cranial Nerves: No cranial nerve deficit.  Psychiatric:        Behavior: Behavior normal.        Thought Content: Thought content normal.        Judgment: Judgment normal.      No results found for any visits on 07/13/22.    The ASCVD Risk score (Arnett DK, et al., 2019) failed to calculate for the following reasons:   Cannot find a previous HDL lab   Cannot find a previous total cholesterol lab    Assessment & Plan:   Problem List Items Addressed This Visit   None Visit Diagnoses     Physical exam    -  Primary   Relevant Orders   Basic metabolic panel   Lipid panel   CBC with Differential/Platelet   TSH   Hepatic function panel     -Discussed flu vaccine-this was given today -Discussed Shingrix and she will consider at some point this year -Obtain screening labs as above -Have strongly recommend she consider colon cancer screening with either Cologuard or colonoscopy.  She is currently without insurance coverage and wishes to defer at this time.  Recent Hemoccults negative. -Continue GYN follow-up for Pap smears and mammogram  No follow-ups on file.    Evelena Peat, MD

## 2022-07-30 ENCOUNTER — Other Ambulatory Visit: Payer: Self-pay | Admitting: Family Medicine

## 2022-12-25 NOTE — Progress Notes (Unsigned)
ACUTE VISIT No chief complaint on file.  HPI: [name] is a 57 y.o. female, who is here today complaining of *** HPI  Review of Systems See other pertinent positives and negatives in HPI.  Current Outpatient Medications on File Prior to Visit  Medication Sig Dispense Refill   cephALEXin (KEFLEX) 500 MG capsule TAKE 1 CAPSULE BY MOUTH 3 TIMES A DAY AS NEEDED FOR FLARE UPS. AVOID REGULAR USE 60 capsule 0   diclofenac (VOLTAREN) 75 MG EC tablet TAKE 1 TABLET BY MOUTH TWICE A DAY 60 tablet 2   hydrocortisone valerate cream (WESTCORT) 0.2 % Apply 1 application topically 2 (two) times daily. On face per dermaatologist 45 g 0   hydroquinone 4 % cream Apply topically 2 (two) times daily. 28.35 g 1   LORazepam (ATIVAN) 0.5 MG tablet Take 1 tablet (0.5 mg total) by mouth 2 (two) times daily as needed for anxiety. 30 tablet 0   Multiple Vitamin (MULITIVITAMIN WITH MINERALS) TABS Take 1 tablet by mouth daily.     spironolactone (ALDACTONE) 100 MG tablet TAKE 1 TABLET DAILY 90 tablet 1   tretinoin (RETIN-A) 0.025 % cream Apply topically at bedtime. 45 g 2   valACYclovir (VALTREX) 1000 MG tablet Take 0.5 tablets (500 mg total) by mouth 2 (two) times daily. As need for outbreak 30 tablet 1   zolpidem (AMBIEN) 10 MG tablet TAKE 1 TABLET BY MOUTH AT NIGHT AS NEEDED FOR INSOMNIA 30 tablet 5   No current facility-administered medications on file prior to visit.    Past Medical History:  Diagnosis Date   Anxiety    ANXIETY, SITUATIONAL 11/27/2010   BACK PAIN, UPPER 02/15/2009   CERVICAL LYMPHADENOPATHY 02/15/2009   INSOMNIA, CHRONIC 02/15/2009   Allergies  Allergen Reactions   Penicillins Other (See Comments)    Childhood history    Social History   Socioeconomic History   Marital status: Single    Spouse name: Not on file   Number of children: 0   Years of education: Not on file   Highest education level: Some college, no degree  Occupational History   Not on file  Tobacco Use    Smoking status: Never   Smokeless tobacco: Never  Vaping Use   Vaping Use: Never used  Substance and Sexual Activity   Alcohol use: Yes    Alcohol/week: 0.0 standard drinks of alcohol    Comment: rare   Drug use: No   Sexual activity: Yes    Birth control/protection: Pill    Comment: 1st intercourse 57 yo-Fewer than 5 partners  Other Topics Concern   Not on file  Social History Narrative   Not on file   Social Determinants of Health   Financial Resource Strain: Not on file  Food Insecurity: No Food Insecurity (05/10/2022)   Hunger Vital Sign    Worried About Running Out of Food in the Last Year: Never true    Ran Out of Food in the Last Year: Never true  Transportation Needs: No Transportation Needs (05/10/2022)   PRAPARE - Hydrologist (Medical): No    Lack of Transportation (Non-Medical): No  Physical Activity: Not on file  Stress: Not on file  Social Connections: Not on file    There were no vitals filed for this visit. There is no height or weight on file to calculate BMI.  Physical Exam  ASSESSMENT AND PLAN: There are no diagnoses linked to this encounter.  No follow-ups on  file.  Breda Bond G. Martinique, MD  [facility]. [place] office.  Discharge Instructions   None

## 2022-12-26 ENCOUNTER — Encounter: Payer: Self-pay | Admitting: Family Medicine

## 2022-12-26 ENCOUNTER — Ambulatory Visit (INDEPENDENT_AMBULATORY_CARE_PROVIDER_SITE_OTHER): Payer: Self-pay | Admitting: Family Medicine

## 2022-12-26 VITALS — BP 120/70 | HR 94 | Temp 98.3°F | Ht 62.6 in | Wt 122.0 lb

## 2022-12-26 DIAGNOSIS — J309 Allergic rhinitis, unspecified: Secondary | ICD-10-CM

## 2022-12-26 DIAGNOSIS — J329 Chronic sinusitis, unspecified: Secondary | ICD-10-CM

## 2022-12-26 DIAGNOSIS — R053 Chronic cough: Secondary | ICD-10-CM

## 2022-12-26 MED ORDER — AMOXICILLIN-POT CLAVULANATE 875-125 MG PO TABS: 1.0000 | ORAL_TABLET | Freq: Two times a day (BID) | ORAL | 0 refills | Status: AC

## 2022-12-26 NOTE — Patient Instructions (Addendum)
A few things to remember from today's visit:  Sinusitis, unspecified chronicity, unspecified location - Plan: amoxicillin-clavulanate (AUGMENTIN) 875-125 MG tablet  Cough, persistent  Allergic rhinitis, unspecified seasonality, unspecified trigger  I do not think you need antibiotic treatment at this time. I sent prescription but do not take it unless facial pain gets worse or you develop fever. If you decide to take it, start a probiotic and stop Cephalexin.  Flonase nasal spray daily at bedtime for 10-14 days. Plain Mucinex. Nasal saline irrigations as needed, mainly at night and in the morning.  If you need refills for medications you take chronically, please call your pharmacy. Do not use My Chart to request refills or for acute issues that need immediate attention. If you send a my chart message, it may take a few days to be addressed, specially if I am not in the office.  Please be sure medication list is accurate. If a new problem present, please set up appointment sooner than planned today.

## 2022-12-28 ENCOUNTER — Other Ambulatory Visit: Payer: Self-pay | Admitting: Family Medicine

## 2022-12-28 ENCOUNTER — Telehealth: Payer: Self-pay | Admitting: Family Medicine

## 2022-12-28 DIAGNOSIS — R053 Chronic cough: Secondary | ICD-10-CM

## 2022-12-28 MED ORDER — BENZONATATE 100 MG PO CAPS: 100.0000 mg | ORAL_CAPSULE | Freq: Two times a day (BID) | ORAL | 0 refills | Status: AC | PRN

## 2022-12-28 NOTE — Telephone Encounter (Signed)
She can try Benzonatate and if not better she can consider having CXR. Follow with PCP is problem is persistent in 10-14 days. Thanks, BJ

## 2022-12-28 NOTE — Telephone Encounter (Signed)
Pt saw dr Martinique on 12-26-2022 and was dx with ?uri and the flonase is not working and she now  coughing yellowish mucus  up  and also would like cough med due to coughing at night [facility] [place] [address] [id number]  . Pt has tried nyquil and delsym with a little relief

## 2022-12-28 NOTE — Telephone Encounter (Signed)
Patient is aware of message below.  

## 2022-12-28 NOTE — Telephone Encounter (Signed)
I spoke with patient. She is aware of message below. She will call us Monday if she would like the x-ray.

## 2022-12-28 NOTE — Telephone Encounter (Signed)
Sent prescription for benzonatate to her pharmacy. Please remind her to follow-up with PCP as recommended. Thanks, BJ

## 2022-12-28 NOTE — Telephone Encounter (Signed)
Pt called to say Benzonatate does not work for her. Pt is requesting an Rx for cough syrup, if possible.  Please advise.

## 2023-01-21 ENCOUNTER — Telehealth: Payer: Self-pay | Admitting: Family Medicine

## 2023-01-21 NOTE — Telephone Encounter (Signed)
Prescription Request  01/21/2023  LOV: 07/13/2022  What is the name of the medication or equipment? tretinoin (RETIN-A) 0.025 % cream   Have you contacted your pharmacy to request a refill? No   Which pharmacy would you like this sent to?  [id number] [address] [place] Phone: [phone]  Fax: [phone]       Patient notified that their request is being sent to the clinical staff for review and that they should receive a response within 2 business days.   Please advise at Mobile [phone] (mobile)

## 2023-01-22 ENCOUNTER — Telehealth: Payer: Self-pay | Admitting: Family Medicine

## 2023-01-22 NOTE — Telephone Encounter (Signed)
Prescription Request  01/22/2023  LOV: 07/13/2022  What is the name of the medication or equipment? zolpidem (AMBIEN) 10 MG tablet   Pt states she is completely out of this medication.  Pt informed that MD is OOO until June. Pt is asking if another provider that can send a PA?  Have you contacted your pharmacy to request a refill? Yes   Which pharmacy would you like this sent to?   [id number] [place] [address] [address], [place] Phone: [phone]  Fax: [phone]      Patient notified that their request is being sent to the clinical staff for review and that they should receive a response within 2 business days.   Please advise at Mobile [phone] (mobile)

## 2023-01-22 NOTE — Telephone Encounter (Signed)
I spoke with the patient and informed her of the message below. Patient stated she would callback to schedule

## 2023-01-22 NOTE — Telephone Encounter (Signed)
Left a message for the patient to return my call.  

## 2023-01-23 MED ORDER — ZOLPIDEM TARTRATE 10 MG PO TABS: ORAL_TABLET | ORAL | 0 refills | Status: DC

## 2023-01-23 NOTE — Telephone Encounter (Signed)
Patient informed rx was sent  

## 2023-01-23 NOTE — Telephone Encounter (Signed)
Patient calling to check on progress of refill. And would like a call to confirm

## 2023-02-18 ENCOUNTER — Other Ambulatory Visit: Payer: Self-pay | Admitting: Adult Health

## 2023-02-20 NOTE — Telephone Encounter (Signed)
Okay for refill?  

## 2023-03-18 ENCOUNTER — Other Ambulatory Visit: Payer: Self-pay | Admitting: Adult Health

## 2023-03-19 NOTE — Telephone Encounter (Signed)
Okay for refill for Dr.Burchette pt?

## 2023-04-25 ENCOUNTER — Other Ambulatory Visit: Payer: Self-pay | Admitting: Family Medicine

## 2023-04-25 NOTE — Telephone Encounter (Signed)
Prescription Request  04/25/2023  LOV: 07/13/2022  What is the name of the medication or equipment? zolpidem (AMBIEN) 10 MG tablet  Have you contacted your pharmacy to request a refill? No   Which pharmacy would you like this sent to?   [id number] [place], [place] - [address] [place] [address] Phone: [phone] Fax: [phone]    Patient notified that their request is being sent to the clinical staff for review and that they should receive a response within 2 business days.   Please advise at Mobile [phone] (mobile)

## 2023-04-26 MED ORDER — ZOLPIDEM TARTRATE 10 MG PO TABS: ORAL_TABLET | ORAL | 1 refills | Status: DC

## 2023-04-26 NOTE — Telephone Encounter (Signed)
Refilled Ambien.  Will need follow-up by late summer or early fall prior to further refills  Kristian Covey MD  Primary Care at [facility]

## 2023-05-31 ENCOUNTER — Other Ambulatory Visit: Payer: Self-pay | Admitting: Family Medicine

## 2023-06-03 ENCOUNTER — Other Ambulatory Visit: Payer: Self-pay | Admitting: Family Medicine

## 2023-06-04 NOTE — Telephone Encounter (Signed)
Pt checking on progress of refill request  cephALEXin (KEFLEX) 500 MG capsule  zolpidem (AMBIEN) 10 MG tablet

## 2023-06-12 NOTE — Telephone Encounter (Signed)
Pt has been scheduled for an OV on Friday, 06/14/23.  Pt states she has a rash all over her face and is meeting her boyfriend's family this weekend, and does not want to meet them like this.   Pt is asking if MD could please send the refill of the [facility], as soon as possible? ... and she will gladly still come in on Friday, if MD needs her to.  Please advise.

## 2023-06-14 ENCOUNTER — Ambulatory Visit (INDEPENDENT_AMBULATORY_CARE_PROVIDER_SITE_OTHER): Payer: Self-pay | Admitting: Family Medicine

## 2023-06-14 ENCOUNTER — Encounter: Payer: Self-pay | Admitting: Family Medicine

## 2023-06-14 VITALS — BP 100/66 | HR 72 | Temp 97.6°F | Ht 62.6 in | Wt 120.9 lb

## 2023-06-14 DIAGNOSIS — R21 Rash and other nonspecific skin eruption: Secondary | ICD-10-CM

## 2023-06-14 DIAGNOSIS — Z79899 Other long term (current) drug therapy: Secondary | ICD-10-CM

## 2023-06-14 LAB — BASIC METABOLIC PANEL
BUN: 16 mg/dL (ref 6–23)
CO2: 28 mEq/L (ref 19–32)
Calcium: 9.9 mg/dL (ref 8.4–10.5)
Chloride: 97 mEq/L (ref 96–112)
Creatinine, Ser: 0.82 mg/dL (ref 0.40–1.20)
GFR: 78.87 mL/min (ref >60.00)
Glucose, Bld: 107 mg/dL — ABNORMAL HIGH (ref 70–99)
Potassium: 3.8 mEq/L (ref 3.5–5.1)
Sodium: 133 mEq/L — ABNORMAL LOW (ref 135–145)

## 2023-06-14 MED ORDER — CEPHALEXIN 500 MG PO CAPS: ORAL_CAPSULE | ORAL | 0 refills | Status: DC

## 2023-06-14 NOTE — Progress Notes (Signed)
   Established Patient Office Visit  Subjective   Patient ID: Sara Camacho, female    DOB: 1966/03/24  Age: 57 y.o. MRN: [id number]  Chief Complaint  Patient presents with   Rash    HPI   Onyinye is seen for the following items  History of recurrent facial rash.  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  She has taken Keflex in the past with good success for flareups.  Requesting refill.  Getting ready go to the beach and would like to have cleared up before then.  She was prescribed Aldactone 100 mg daily per dermatologist.  No recent electrolytes.  Blood pressure stable.  No recent dizziness.  Past Medical History:  Diagnosis Date   Anxiety    ANXIETY, SITUATIONAL 11/27/2010   BACK PAIN, UPPER 02/15/2009   CERVICAL LYMPHADENOPATHY 02/15/2009   INSOMNIA, CHRONIC 02/15/2009   Past Surgical History:  Procedure Laterality Date   AUGMENTATION MAMMAPLASTY Bilateral 10/29/2001   augmentation-removal-reinsertion   Eyebrow lift     GYNECOLOGIC CRYOSURGERY  1987    reports that she has never smoked. She has never used smokeless tobacco. She reports current alcohol use. She reports that she does not use drugs. family history includes Breast cancer in her maternal grandmother; Cancer in her father and mother. Allergies  Allergen Reactions   Penicillins Other (See Comments)    Childhood history      Review of Systems  Constitutional:  Negative for weight loss.  Cardiovascular:  Negative for chest pain.  Skin:  Positive for rash.      Objective:     BP 100/66 (BP Location: Left Arm, Patient Position: Sitting, Cuff Size: Normal)   Pulse 72   Temp 97.6 F (36.4 C) (Oral)   Ht 5' 2.6" (1.59 m)   Wt 120 lb 14.4 oz (54.8 kg)   SpO2 100%   BMI 21.69 kg/m  BP Readings from Last 3 Encounters:  06/14/23 100/66  12/26/22 120/70  07/13/22 110/64   Wt Readings from Last 3 Encounters:  06/14/23 120 lb 14.4 oz (54.8 kg)  12/26/22 122 lb (55.3 kg)  07/13/22 117 lb 6.4  oz (53.3 kg)      Physical Exam Vitals reviewed.  Constitutional:      Appearance: Normal appearance.  Cardiovascular:     Rate and Rhythm: Normal rate and regular rhythm.  Pulmonary:     Effort: Pulmonary effort is normal.     Breath sounds: Normal breath sounds.  Skin:    Comments: She has some small angiectasias in the cheek region bilaterally.  Couple of very small erythematous papules chin region.  No pustules.  Neurological:     Mental Status: She is alert.      No results found for any visits on 06/14/23.    The 10-year ASCVD risk score (Arnett DK, et al., 2019) is: 1.7%    Assessment & Plan:    #1 history of recurrent facial rash.  Has been diagnosed with perioral dermatitis in the past.  Has responded well to oral Keflex.  Refill provided.  #2 chronic Aldactone use prescribed per dermatology.  No recent electrolytes.  Check basic metabolic panel.      Evelena Peat, MD

## 2023-08-01 ENCOUNTER — Other Ambulatory Visit: Payer: Self-pay | Admitting: Family Medicine

## 2023-08-02 NOTE — Telephone Encounter (Signed)
Her last fill was 9/9, so they won't let her refill until 10/8 and Dr. Caryl Never will be back on the 7th so it's ok to wait

## 2023-08-23 ENCOUNTER — Other Ambulatory Visit: Payer: Self-pay | Admitting: Family Medicine

## 2023-12-03 ENCOUNTER — Other Ambulatory Visit: Payer: Self-pay | Admitting: Family Medicine

## 2024-01-29 ENCOUNTER — Other Ambulatory Visit: Payer: Self-pay | Admitting: Family Medicine

## 2024-04-16 ENCOUNTER — Encounter (HOSPITAL_BASED_OUTPATIENT_CLINIC_OR_DEPARTMENT_OTHER): Payer: Self-pay

## 2024-04-16 ENCOUNTER — Other Ambulatory Visit: Payer: Self-pay

## 2024-04-16 ENCOUNTER — Ambulatory Visit: Payer: Self-pay

## 2024-04-16 ENCOUNTER — Emergency Department (HOSPITAL_BASED_OUTPATIENT_CLINIC_OR_DEPARTMENT_OTHER)
Admission: EM | Admit: 2024-04-16 | Discharge: 2024-04-16 | Disposition: A | Discharge status: Home / Self Care | Payer: Self-pay | Attending: Emergency Medicine | Admitting: Emergency Medicine

## 2024-04-16 DIAGNOSIS — Y92410 Unspecified street and highway as the place of occurrence of the external cause: Secondary | ICD-10-CM | POA: Insufficient documentation

## 2024-04-16 DIAGNOSIS — M545 Low back pain, unspecified: Secondary | ICD-10-CM | POA: Diagnosis present

## 2024-04-16 DIAGNOSIS — S39012A Strain of muscle, fascia and tendon of lower back, initial encounter: Secondary | ICD-10-CM | POA: Diagnosis not present

## 2024-04-16 DIAGNOSIS — S161XXA Strain of muscle, fascia and tendon at neck level, initial encounter: Secondary | ICD-10-CM | POA: Insufficient documentation

## 2024-04-16 MED ORDER — CELECOXIB 200 MG PO CAPS: 200.0000 mg | ORAL_CAPSULE | Freq: Two times a day (BID) | ORAL | 0 refills | Status: DC

## 2024-04-16 MED ORDER — CYCLOBENZAPRINE HCL 10 MG PO TABS: 5.0000 mg | ORAL_TABLET | Freq: Two times a day (BID) | ORAL | 0 refills | Status: DC | PRN
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
## 2024-04-16 NOTE — Telephone Encounter (Signed)
 I spoke with the patient and she declined follow up at this time and reported she will try to proceed to Urgent care this evening for treatment. Patient stated that if she does not go to urgent care she will call to schedule an appointment tomorrow
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
## 2024-04-16 NOTE — Discharge Instructions (Signed)
 Return to the emergency department immediately if you develop any of the following symptoms: You have numbness, tingling, or weakness in the arms or legs. You develop severe headaches not relieved with medicine. You have severe neck pain, especially tenderness in the middle of the back of your neck. You have changes in bowel or bladder control. There is increasing pain in any area of the body. You have shortness of breath, light-headedness, dizziness, or fainting. You have chest pain. You feel sick to your stomach (nauseous), throw up (vomit), or sweat. You have increasing abdominal discomfort. There is blood in your urine, stool, or vomit. You have pain in your shoulder (shoulder strap areas). You feel your symptoms are getting worse.

## 2024-05-05 ENCOUNTER — Encounter: Payer: Self-pay | Admitting: Family Medicine

## 2024-05-05 ENCOUNTER — Ambulatory Visit (INDEPENDENT_AMBULATORY_CARE_PROVIDER_SITE_OTHER): Payer: Self-pay | Admitting: Family Medicine

## 2024-05-05 VITALS — BP 114/62 | HR 50 | Temp 97.7°F | Wt 123.7 lb

## 2024-05-05 DIAGNOSIS — M545 Low back pain, unspecified: Secondary | ICD-10-CM

## 2024-05-05 MED ORDER — TRAMADOL HCL 50 MG PO TABS: 50.0000 mg | ORAL_TABLET | Freq: Four times a day (QID) | ORAL | 0 refills | Status: AC | PRN

## 2024-05-05 NOTE — Patient Instructions (Signed)
 I will send in some Ultram  to use as needed for pain.
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
## 2024-05-25 ENCOUNTER — Other Ambulatory Visit: Payer: Self-pay | Admitting: Family Medicine

## 2024-05-29 ENCOUNTER — Encounter: Payer: Self-pay | Admitting: Physical Therapy

## 2024-05-29 ENCOUNTER — Ambulatory Visit: Payer: Self-pay | Attending: Family Medicine | Admitting: Physical Therapy

## 2024-05-29 ENCOUNTER — Other Ambulatory Visit: Payer: Self-pay

## 2024-05-29 DIAGNOSIS — M6281 Muscle weakness (generalized): Secondary | ICD-10-CM

## 2024-05-29 DIAGNOSIS — M5459 Other low back pain: Secondary | ICD-10-CM

## 2024-05-29 DIAGNOSIS — R531 Weakness: Secondary | ICD-10-CM | POA: Insufficient documentation

## 2024-05-29 DIAGNOSIS — M545 Low back pain, unspecified: Secondary | ICD-10-CM | POA: Insufficient documentation
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
## 2024-05-29 NOTE — Patient Instructions (Signed)
"  TAKE YOUR MEDS"   Acronym to reduce the body's inflammatory response  1) MEDITATION: Mindfulness and meditation have been proven to reduce the brain's over-sensitization and stimulation   2) EXERCISE: Activity pumps the muscles which in return washes those inflammatory cells away   3)DIET: Eat healthy foods especially plants. Stay away from processed foods, especially sugar which has been proven to INCREASE pain levels!   4)SLEEP: Prioritize sleep right now.  Avoid screens for the 30 minutes before bedtime.  A cool, dark room is best for sleeping.  Your body needs quality sleep for healing.     Ruben Im PT
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
## 2024-06-08 ENCOUNTER — Encounter: Payer: Self-pay | Admitting: Physical Therapy

## 2024-06-08 ENCOUNTER — Ambulatory Visit: Payer: Self-pay | Admitting: Physical Therapy

## 2024-06-08 DIAGNOSIS — M5459 Other low back pain: Secondary | ICD-10-CM

## 2024-06-08 DIAGNOSIS — M6281 Muscle weakness (generalized): Secondary | ICD-10-CM

## 2024-06-10 ENCOUNTER — Ambulatory Visit: Payer: Self-pay
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
## 2024-06-11 NOTE — Telephone Encounter (Signed)
 I spoke with the patient and she reported back pain. Patient has been scheduled with PCP upon request and declined visit with available provider today. Patient advised to seek treatment at nearest [facility] or ER for any worsening symptoms. Patient voiced understanding

## 2024-06-15 ENCOUNTER — Ambulatory Visit (INDEPENDENT_AMBULATORY_CARE_PROVIDER_SITE_OTHER): Payer: Self-pay | Admitting: Family Medicine

## 2024-06-15 ENCOUNTER — Encounter: Payer: Self-pay | Admitting: Family Medicine

## 2024-06-15 VITALS — BP 120/70 | HR 75 | Temp 97.4°F | Wt 121.6 lb

## 2024-06-15 DIAGNOSIS — M545 Low back pain, unspecified: Secondary | ICD-10-CM

## 2024-06-15 MED ORDER — MELOXICAM 15 MG PO TABS: 15.0000 mg | ORAL_TABLET | Freq: Every day | ORAL | 0 refills | Status: AC

## 2024-06-15 MED ORDER — METHOCARBAMOL 500 MG PO TABS: 500.0000 mg | ORAL_TABLET | Freq: Three times a day (TID) | ORAL | 0 refills | Status: AC | PRN

## 2024-06-15 NOTE — Patient Instructions (Signed)
 Let me know if pain not improving in two weeks with PT.
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
## 2024-06-16 ENCOUNTER — Ambulatory Visit: Payer: Self-pay | Admitting: Physical Therapy

## 2024-06-16 ENCOUNTER — Encounter: Payer: Self-pay | Admitting: Physical Therapy

## 2024-06-16 DIAGNOSIS — M6281 Muscle weakness (generalized): Secondary | ICD-10-CM

## 2024-06-16 DIAGNOSIS — M5459 Other low back pain: Secondary | ICD-10-CM

## 2024-06-19 ENCOUNTER — Encounter: Payer: Self-pay | Admitting: Physical Therapy

## 2024-06-23 ENCOUNTER — Encounter: Payer: Self-pay | Admitting: Physical Therapy

## 2024-06-23 ENCOUNTER — Ambulatory Visit: Payer: Self-pay | Admitting: Physical Therapy

## 2024-06-23 DIAGNOSIS — M5459 Other low back pain: Secondary | ICD-10-CM

## 2024-06-23 DIAGNOSIS — M6281 Muscle weakness (generalized): Secondary | ICD-10-CM
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
## 2024-06-25 ENCOUNTER — Ambulatory Visit (HOSPITAL_BASED_OUTPATIENT_CLINIC_OR_DEPARTMENT_OTHER): Payer: Self-pay | Admitting: Physical Therapy
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
## 2024-06-26 ENCOUNTER — Ambulatory Visit: Payer: Self-pay | Admitting: Physical Therapy

## 2024-06-26 ENCOUNTER — Encounter: Payer: Self-pay | Admitting: Physical Therapy

## 2024-06-26 DIAGNOSIS — M6281 Muscle weakness (generalized): Secondary | ICD-10-CM

## 2024-06-26 DIAGNOSIS — M5459 Other low back pain: Secondary | ICD-10-CM

## 2024-06-26 NOTE — Patient Instructions (Signed)

## 2024-07-07 ENCOUNTER — Ambulatory Visit: Payer: Self-pay | Attending: Family Medicine | Admitting: Physical Therapy

## 2024-07-07 DIAGNOSIS — M5459 Other low back pain: Secondary | ICD-10-CM | POA: Insufficient documentation

## 2024-07-07 DIAGNOSIS — M6281 Muscle weakness (generalized): Secondary | ICD-10-CM | POA: Insufficient documentation
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
## 2024-07-07 NOTE — Patient Instructions (Signed)

## 2024-07-10 ENCOUNTER — Ambulatory Visit: Payer: Self-pay | Admitting: Physical Therapy

## 2024-07-10 DIAGNOSIS — M5459 Other low back pain: Secondary | ICD-10-CM

## 2024-07-10 DIAGNOSIS — M6281 Muscle weakness (generalized): Secondary | ICD-10-CM
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
## 2024-07-14 ENCOUNTER — Encounter: Payer: Self-pay | Admitting: Physical Therapy

## 2024-07-14 ENCOUNTER — Ambulatory Visit: Payer: Self-pay | Admitting: Physical Therapy

## 2024-07-14 DIAGNOSIS — M6281 Muscle weakness (generalized): Secondary | ICD-10-CM

## 2024-07-14 DIAGNOSIS — M5459 Other low back pain: Secondary | ICD-10-CM
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
## 2024-07-16 ENCOUNTER — Other Ambulatory Visit: Payer: Self-pay | Admitting: Family Medicine

## 2024-07-17 ENCOUNTER — Ambulatory Visit: Payer: Self-pay | Admitting: Physical Therapy

## 2024-07-17 DIAGNOSIS — M5459 Other low back pain: Secondary | ICD-10-CM

## 2024-07-17 DIAGNOSIS — M6281 Muscle weakness (generalized): Secondary | ICD-10-CM
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
## 2024-07-20 ENCOUNTER — Other Ambulatory Visit: Payer: Self-pay | Admitting: *Deleted

## 2024-07-20 MED ORDER — ZOLPIDEM TARTRATE 10 MG PO TABS: ORAL_TABLET | ORAL | 0 refills | Status: DC

## 2024-07-20 NOTE — Telephone Encounter (Signed)
 Copied from CRM [id number]. Topic: Clinical - Prescription Issue >> Jul 20, 2024 11:32 AM Macario HERO wrote: Reason for CRM: Patient called regarding her prescription request for zolpidem  (AMBIEN ) 10 MG tablet [[id number]] because she is currently out of the medication as of today.

## 2024-07-21 ENCOUNTER — Ambulatory Visit: Payer: Self-pay | Admitting: Physical Therapy

## 2024-07-21 DIAGNOSIS — M5459 Other low back pain: Secondary | ICD-10-CM

## 2024-07-21 DIAGNOSIS — M6281 Muscle weakness (generalized): Secondary | ICD-10-CM
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
## 2024-07-24 ENCOUNTER — Ambulatory Visit: Payer: Self-pay | Admitting: Physical Therapy

## 2024-07-24 ENCOUNTER — Encounter: Payer: Self-pay | Admitting: Physical Therapy

## 2024-07-24 DIAGNOSIS — M5459 Other low back pain: Secondary | ICD-10-CM

## 2024-07-24 DIAGNOSIS — M6281 Muscle weakness (generalized): Secondary | ICD-10-CM

## 2024-07-31 ENCOUNTER — Encounter (HOSPITAL_BASED_OUTPATIENT_CLINIC_OR_DEPARTMENT_OTHER): Payer: Self-pay | Admitting: Physical Therapy

## 2024-07-31 ENCOUNTER — Ambulatory Visit (HOSPITAL_BASED_OUTPATIENT_CLINIC_OR_DEPARTMENT_OTHER): Payer: Self-pay | Attending: Family Medicine | Admitting: Physical Therapy

## 2024-07-31 DIAGNOSIS — M5459 Other low back pain: Secondary | ICD-10-CM | POA: Diagnosis present

## 2024-07-31 DIAGNOSIS — M6281 Muscle weakness (generalized): Secondary | ICD-10-CM | POA: Insufficient documentation
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
## 2024-08-07 ENCOUNTER — Encounter (HOSPITAL_BASED_OUTPATIENT_CLINIC_OR_DEPARTMENT_OTHER): Payer: Self-pay | Admitting: Physical Therapy

## 2024-08-07 ENCOUNTER — Ambulatory Visit (HOSPITAL_BASED_OUTPATIENT_CLINIC_OR_DEPARTMENT_OTHER): Payer: Self-pay | Admitting: Physical Therapy

## 2024-08-07 DIAGNOSIS — M5459 Other low back pain: Secondary | ICD-10-CM

## 2024-08-07 DIAGNOSIS — M6281 Muscle weakness (generalized): Secondary | ICD-10-CM
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
## 2024-08-12 ENCOUNTER — Other Ambulatory Visit: Payer: Self-pay | Admitting: Family Medicine

## 2024-08-13 ENCOUNTER — Ambulatory Visit (HOSPITAL_BASED_OUTPATIENT_CLINIC_OR_DEPARTMENT_OTHER): Payer: Self-pay | Admitting: Physical Therapy

## 2024-08-13 ENCOUNTER — Encounter (HOSPITAL_BASED_OUTPATIENT_CLINIC_OR_DEPARTMENT_OTHER): Payer: Self-pay | Admitting: Physical Therapy

## 2024-08-13 DIAGNOSIS — M5459 Other low back pain: Secondary | ICD-10-CM

## 2024-08-13 DIAGNOSIS — M6281 Muscle weakness (generalized): Secondary | ICD-10-CM
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
## 2024-08-18 ENCOUNTER — Encounter (HOSPITAL_BASED_OUTPATIENT_CLINIC_OR_DEPARTMENT_OTHER): Payer: Self-pay | Admitting: Physical Therapy

## 2024-08-18 ENCOUNTER — Ambulatory Visit (HOSPITAL_BASED_OUTPATIENT_CLINIC_OR_DEPARTMENT_OTHER): Payer: Self-pay | Admitting: Physical Therapy

## 2024-08-18 DIAGNOSIS — M5459 Other low back pain: Secondary | ICD-10-CM | POA: Diagnosis not present

## 2024-08-18 DIAGNOSIS — M6281 Muscle weakness (generalized): Secondary | ICD-10-CM
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
## 2024-08-28 ENCOUNTER — Ambulatory Visit: Payer: Self-pay | Attending: Family Medicine | Admitting: Physical Therapy

## 2024-08-28 ENCOUNTER — Encounter: Payer: Self-pay | Admitting: Physical Therapy

## 2024-08-28 DIAGNOSIS — M6281 Muscle weakness (generalized): Secondary | ICD-10-CM | POA: Insufficient documentation

## 2024-08-28 DIAGNOSIS — M5459 Other low back pain: Secondary | ICD-10-CM | POA: Insufficient documentation
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
## 2024-09-02 ENCOUNTER — Ambulatory Visit (HOSPITAL_BASED_OUTPATIENT_CLINIC_OR_DEPARTMENT_OTHER): Payer: Self-pay | Admitting: Physical Therapy

## 2024-09-04 ENCOUNTER — Encounter (HOSPITAL_BASED_OUTPATIENT_CLINIC_OR_DEPARTMENT_OTHER): Payer: Self-pay | Admitting: Physical Therapy

## 2024-09-04 ENCOUNTER — Ambulatory Visit (HOSPITAL_BASED_OUTPATIENT_CLINIC_OR_DEPARTMENT_OTHER): Payer: Self-pay | Attending: Family Medicine | Admitting: Physical Therapy

## 2024-09-04 DIAGNOSIS — M5459 Other low back pain: Secondary | ICD-10-CM | POA: Insufficient documentation

## 2024-09-04 DIAGNOSIS — M6281 Muscle weakness (generalized): Secondary | ICD-10-CM | POA: Insufficient documentation
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
## 2024-09-16 ENCOUNTER — Ambulatory Visit: Payer: Self-pay | Attending: Family Medicine | Admitting: Physical Therapy

## 2024-09-16 ENCOUNTER — Encounter: Payer: Self-pay | Admitting: Physical Therapy

## 2024-09-16 DIAGNOSIS — M6281 Muscle weakness (generalized): Secondary | ICD-10-CM | POA: Diagnosis present

## 2024-09-16 DIAGNOSIS — M5459 Other low back pain: Secondary | ICD-10-CM | POA: Diagnosis present
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
## 2024-09-22 ENCOUNTER — Ambulatory Visit (HOSPITAL_BASED_OUTPATIENT_CLINIC_OR_DEPARTMENT_OTHER): Payer: Self-pay | Admitting: Physical Therapy

## 2024-09-30 ENCOUNTER — Ambulatory Visit: Payer: Self-pay | Attending: Family Medicine | Admitting: Physical Therapy

## 2024-09-30 ENCOUNTER — Encounter: Payer: Self-pay | Admitting: Physical Therapy

## 2024-09-30 DIAGNOSIS — M5459 Other low back pain: Secondary | ICD-10-CM | POA: Diagnosis present

## 2024-09-30 DIAGNOSIS — M6281 Muscle weakness (generalized): Secondary | ICD-10-CM | POA: Insufficient documentation
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
## 2024-10-07 ENCOUNTER — Encounter: Payer: Self-pay | Admitting: Physical Therapy

## 2024-10-07 ENCOUNTER — Ambulatory Visit: Payer: Self-pay | Admitting: Physical Therapy

## 2024-10-07 DIAGNOSIS — M5459 Other low back pain: Secondary | ICD-10-CM

## 2024-10-07 DIAGNOSIS — M6281 Muscle weakness (generalized): Secondary | ICD-10-CM
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
## 2024-10-15 ENCOUNTER — Encounter: Payer: Self-pay | Admitting: Physical Therapy

## 2024-10-15 ENCOUNTER — Ambulatory Visit: Payer: Self-pay | Admitting: Physical Therapy

## 2024-10-15 ENCOUNTER — Telehealth: Payer: Self-pay | Admitting: Family Medicine

## 2024-10-15 DIAGNOSIS — M5459 Other low back pain: Secondary | ICD-10-CM | POA: Diagnosis not present

## 2024-10-15 DIAGNOSIS — M6281 Muscle weakness (generalized): Secondary | ICD-10-CM

## 2024-10-15 NOTE — Telephone Encounter (Signed)
 Prescription Request  10/15/2024  LOV: 08/12/2024  What is the name of the medication or equipment? zolpidem  (AMBIEN ) 10 MG tablet   Have you contacted your pharmacy to request a refill? No   Which pharmacy would you like this sent to?  [facility] [place], [place] - [address] [place] [place] [address] Phone: [phone] Fax: [phone]     Patient notified that their request is being sent to the clinical staff for review and that they should receive a response within 2 business days.   Please advise at Mobile [phone] (mobile)
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
## 2024-10-16 ENCOUNTER — Ambulatory Visit: Payer: Self-pay | Admitting: Physical Therapy

## 2024-10-18 MED ORDER — ZOLPIDEM TARTRATE 10 MG PO TABS: ORAL_TABLET | ORAL | 5 refills | Status: AC

## 2024-10-18 NOTE — Addendum Note (Signed)
 Addended by: MICHEAL WOLM ORN on: 10/18/2024 05:27 PM   Modules accepted: Orders

## 2024-10-20 ENCOUNTER — Encounter: Payer: Self-pay | Admitting: Physical Therapy

## 2024-10-20 ENCOUNTER — Ambulatory Visit: Payer: Self-pay | Admitting: Physical Therapy

## 2024-10-20 DIAGNOSIS — M6281 Muscle weakness (generalized): Secondary | ICD-10-CM

## 2024-10-20 DIAGNOSIS — M5459 Other low back pain: Secondary | ICD-10-CM | POA: Diagnosis not present
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
## 2024-10-27 ENCOUNTER — Ambulatory Visit: Payer: Self-pay | Admitting: Physical Therapy

## 2024-10-27 ENCOUNTER — Encounter: Payer: Self-pay | Admitting: Physical Therapy

## 2024-10-27 DIAGNOSIS — M5459 Other low back pain: Secondary | ICD-10-CM | POA: Diagnosis not present

## 2024-10-27 DIAGNOSIS — M6281 Muscle weakness (generalized): Secondary | ICD-10-CM
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
## 2024-11-02 ENCOUNTER — Encounter: Payer: Self-pay | Admitting: Physical Therapy

## 2024-11-02 ENCOUNTER — Ambulatory Visit: Payer: Self-pay | Attending: Family Medicine | Admitting: Physical Therapy

## 2024-11-02 DIAGNOSIS — M6281 Muscle weakness (generalized): Secondary | ICD-10-CM | POA: Insufficient documentation

## 2024-11-02 DIAGNOSIS — M5459 Other low back pain: Secondary | ICD-10-CM | POA: Insufficient documentation
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
## 2024-11-11 ENCOUNTER — Ambulatory Visit: Payer: Self-pay | Admitting: Physical Therapy

## 2024-11-11 ENCOUNTER — Encounter: Payer: Self-pay | Admitting: Physical Therapy

## 2024-11-11 DIAGNOSIS — M6281 Muscle weakness (generalized): Secondary | ICD-10-CM

## 2024-11-11 DIAGNOSIS — M5459 Other low back pain: Secondary | ICD-10-CM
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
## 2024-11-16 ENCOUNTER — Other Ambulatory Visit: Payer: Self-pay | Admitting: Family Medicine

## 2024-11-26 ENCOUNTER — Ambulatory Visit: Admitting: Physical Therapy

## 2024-11-26 ENCOUNTER — Encounter: Payer: Self-pay | Admitting: Physical Therapy

## 2024-11-26 DIAGNOSIS — M5459 Other low back pain: Secondary | ICD-10-CM

## 2024-11-26 DIAGNOSIS — M6281 Muscle weakness (generalized): Secondary | ICD-10-CM
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
## 2024-12-01 ENCOUNTER — Ambulatory Visit: Attending: Family Medicine | Admitting: Physical Therapy

## 2024-12-01 ENCOUNTER — Encounter: Payer: Self-pay | Admitting: Physical Therapy

## 2024-12-01 DIAGNOSIS — M6281 Muscle weakness (generalized): Secondary | ICD-10-CM

## 2024-12-01 DIAGNOSIS — M5459 Other low back pain: Secondary | ICD-10-CM

## 2024-12-03 ENCOUNTER — Ambulatory Visit: Payer: Self-pay | Admitting: *Deleted
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
## 2024-12-03 NOTE — Telephone Encounter (Signed)
 Appt scheduled with PCP on 2/6 at 10:45am.

## 2024-12-04 ENCOUNTER — Encounter: Payer: Self-pay | Admitting: Family Medicine

## 2024-12-04 ENCOUNTER — Ambulatory Visit: Payer: Self-pay | Admitting: Family Medicine

## 2024-12-04 VITALS — BP 102/66 | HR 83 | Wt 128.7 lb

## 2024-12-04 DIAGNOSIS — M5416 Radiculopathy, lumbar region: Secondary | ICD-10-CM

## 2024-12-04 MED ORDER — GABAPENTIN 100 MG PO CAPS: 100.0000 mg | ORAL_CAPSULE | Freq: Three times a day (TID) | ORAL | 3 refills | Status: AC
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
## 2024-12-08 ENCOUNTER — Ambulatory Visit: Admitting: Physical Therapy

## 2024-12-09 ENCOUNTER — Other Ambulatory Visit: Payer: Self-pay

## 2024-12-11 ENCOUNTER — Ambulatory Visit (HOSPITAL_BASED_OUTPATIENT_CLINIC_OR_DEPARTMENT_OTHER): Payer: Self-pay | Admitting: Physical Therapy

## 2024-12-15 ENCOUNTER — Ambulatory Visit: Admitting: Physical Therapy

## 2024-12-22 ENCOUNTER — Ambulatory Visit: Admitting: Physical Therapy

## 2024-12-23 ENCOUNTER — Ambulatory Visit: Payer: Self-pay | Admitting: Physical Therapy

## 2024-12-29 ENCOUNTER — Ambulatory Visit: Admitting: Physical Therapy

## 2024-12-30 ENCOUNTER — Ambulatory Visit: Payer: Self-pay | Admitting: Physical Therapy

## 2025-01-06 ENCOUNTER — Ambulatory Visit: Attending: Family Medicine | Admitting: Physical Therapy
